# Patient Record
Sex: Male | Born: 1950 | Race: White | Hispanic: No | Marital: Married | Smoking: Former smoker
Health system: Southern US, Community
[De-identification: ages and names within clinical notes are randomized; demographics above are authoritative.]

## PROBLEM LIST (undated history)

## (undated) DIAGNOSIS — E119 Type 2 diabetes mellitus without complications: Secondary | ICD-10-CM

## (undated) HISTORY — DX: Type 2 diabetes mellitus without complications: E11.9

---

## 1999-03-20 ENCOUNTER — Ambulatory Visit (HOSPITAL_BASED_OUTPATIENT_CLINIC_OR_DEPARTMENT_OTHER): Admission: RE | Admit: 1999-03-20 | Discharge: 1999-03-20 | Payer: Self-pay | Admitting: General Surgery

## 2014-03-30 ENCOUNTER — Encounter: Payer: 59 | Attending: Family Medicine

## 2014-03-30 VITALS — Ht 74.0 in | Wt 194.2 lb

## 2014-03-30 DIAGNOSIS — E119 Type 2 diabetes mellitus without complications: Secondary | ICD-10-CM | POA: Insufficient documentation

## 2014-03-30 DIAGNOSIS — Z713 Dietary counseling and surveillance: Secondary | ICD-10-CM | POA: Diagnosis present

## 2014-03-30 NOTE — Progress Notes (Signed)
Patient was seen on 03/30/2014 for the first of a series of three diabetes self-management courses at the [facility].  Patient Education Plan per assessed needs and concerns is to attend four course education program for Diabetes Self Management Education.  Current HbA1c: 6.5%  The following learning objectives were met by the patient during this class:  Describe diabetes  State some common risk factors for diabetes  Defines the role of glucose and insulin  Identifies type of diabetes and pathophysiology  Describe the relationship between diabetes and cardiovascular risk  State the members of the Healthcare Team  States the rationale for glucose monitoring  State when to test glucose  State their individual Target Range  State the importance of logging glucose readings  Describe how to interpret glucose readings  Identifies A1C target  Explain the correlation between A1c and eAG values  State symptoms and treatment of high blood glucose  State symptoms and treatment of low blood glucose  Explain proper technique for glucose testing  Identifies proper sharps disposal  Handouts given during class include:  Living Well with Diabetes book  Carb Counting and Meal Planning book  Meal Plan Card  Carbohydrate guide  Meal planning worksheet  Low Sodium Flavoring Tips  The diabetes portion plate  C1U to eAG Conversion Chart  Diabetes Medications  Diabetes Recommended Care Schedule  Support Group  Diabetes Success Plan  Core Class Satisfaction Survey  Follow-Up Plan:  Attend core 2

## 2014-04-06 ENCOUNTER — Encounter: Payer: 59 | Attending: Family Medicine

## 2014-04-06 DIAGNOSIS — E119 Type 2 diabetes mellitus without complications: Secondary | ICD-10-CM | POA: Diagnosis present

## 2014-04-06 DIAGNOSIS — Z713 Dietary counseling and surveillance: Secondary | ICD-10-CM | POA: Diagnosis present

## 2014-04-07 NOTE — Progress Notes (Signed)

## 2014-04-20 DIAGNOSIS — E119 Type 2 diabetes mellitus without complications: Secondary | ICD-10-CM | POA: Diagnosis not present

## 2014-04-24 NOTE — Progress Notes (Signed)
Patient was seen on 9/17/15for the third of a series of three diabetes self-management courses at the [facility]. The following learning objectives were met by the patient during this class:    State the amount of activity recommended for healthy living   Describe activities suitable for individual needs   Identify ways to regularly incorporate activity into daily life   Identify barriers to activity and ways to over come these barriers  Identify diabetes medications being personally used and their primary action for lowering glucose and possible side effects   Describe role of stress on blood glucose and develop strategies to address psychosocial issues   Identify diabetes complications and ways to prevent them  Explain how to manage diabetes during illness   Evaluate success in meeting personal goal   Establish 2-3 goals that they will plan to diligently work on until they return for the  22-monthfollow-up visit  Goals:   I will count my carb choices at most meals and snacks  I will be active 40 minutes or more 5 times a week  I will test my glucose at least 2 times a day, 7 days a week  Retire from my stressful job  I will look at patterns in my record book at least 4 days a month  To help manage stress I will  Do photography and walk  at least 3 times a week  No potential barriers noted that may exist toward accomplishing goals Support Plan: Family Support, On-Line resources, [facility]  Plan:  Attend Core 4 in 4 months  Bring food record and glucose log to all healthcare visits

## 2014-08-07 ENCOUNTER — Encounter: Payer: 59 | Attending: Family Medicine

## 2014-08-07 DIAGNOSIS — E119 Type 2 diabetes mellitus without complications: Secondary | ICD-10-CM | POA: Diagnosis present

## 2014-08-07 DIAGNOSIS — Z713 Dietary counseling and surveillance: Secondary | ICD-10-CM | POA: Insufficient documentation

## 2014-08-08 NOTE — Progress Notes (Signed)
Appt start time: 1730 end time:  1830.  Patient was seen on 08/07/2013 for a review of the series of three diabetes self-management courses at the [facility]. The following learning objectives were met by the patient during this class:  . Reviewed blood glucose monitoring and interpretation including the recommended target ranges and Hgb A1c.  . Reviewed on carb counting, importance of regularly scheduled meals/snacks, and meal planning.  . Reviewed the effects of physical activity on glucose levels and long-term glucose control.  Recommended goal of 150 minutes of physical activity/week. . Reviewed patient medications and discussed role of medication on blood glucose and possible side effects. . Discussed strategies to manage stress, psychosocial issues, and other obstacles to diabetes management. . Encouraged moderate weight reduction to improve glucose levels.   . Reviewed short-term complications: hyper- and hypo-glycemia.  Discussed causes, symptoms, and treatment options. . Reviewed prevention, detection, and treatment of long-term complications.  Discussed the role of prolonged elevated glucose levels on body systems.  Goals:  Follow Diabetes Meal Plan as instructed  Eat 3 meals and 2 snacks, every 3-5 hrs  Limit carbohydrate intake to 45 grams carbohydrate/meal Limit carbohydrate intake to 15 grams carbohydrate/snack Add lean protein foods to meals/snacks  Monitor glucose levels as instructed by your doctor  Aim for goal of 15-30 mins of physical activity daily as tolerated  Bring food record and glucose log to your next nutrition visit  

## 2014-10-19 ENCOUNTER — Other Ambulatory Visit: Payer: Self-pay | Admitting: Gastroenterology

## 2015-05-15 DIAGNOSIS — Z136 Encounter for screening for cardiovascular disorders: Secondary | ICD-10-CM | POA: Diagnosis not present

## 2015-05-15 DIAGNOSIS — E119 Type 2 diabetes mellitus without complications: Secondary | ICD-10-CM | POA: Diagnosis not present

## 2015-05-15 DIAGNOSIS — Z23 Encounter for immunization: Secondary | ICD-10-CM | POA: Diagnosis not present

## 2015-05-15 DIAGNOSIS — Z125 Encounter for screening for malignant neoplasm of prostate: Secondary | ICD-10-CM | POA: Diagnosis not present

## 2015-05-15 DIAGNOSIS — Z0000 Encounter for general adult medical examination without abnormal findings: Secondary | ICD-10-CM | POA: Diagnosis not present

## 2017-08-12 ENCOUNTER — Ambulatory Visit: Payer: Medicare Other | Admitting: Podiatry

## 2017-08-12 DIAGNOSIS — Z79899 Other long term (current) drug therapy: Secondary | ICD-10-CM | POA: Diagnosis not present

## 2017-08-12 DIAGNOSIS — B351 Tinea unguium: Secondary | ICD-10-CM | POA: Diagnosis not present

## 2017-08-12 MED ORDER — TERBINAFINE HCL 250 MG PO TABS: 250.0000 mg | ORAL_TABLET | Freq: Every day | ORAL | 0 refills | Status: DC

## 2017-08-12 NOTE — Progress Notes (Signed)
   Subjective:    Patient ID: Zachary Trujillo, male    DOB: 05/14/1951, 67 y.o.   MRN: [id number]  HPI    Review of Systems  All other systems reviewed and are negative.      Objective:   Physical Exam        Assessment & Plan:

## 2017-08-16 NOTE — Progress Notes (Signed)
   Subjective: 67 year old male with past medical history of diabetes mellitus presenting today as a new patient with a complaint of discoloration and thickening of the right great toe that began several months ago.  He is concerned for fungal nails and is afraid he is going to lose the nail.  He has not done anything for treatment.  There are no modifying factors noted.  Patient is here for further evaluation and treatment.   Past Medical History:  Diagnosis Date  . Diabetes mellitus without complication     Objective: Physical Exam General: The patient is alert and oriented x3 in no acute distress.  Dermatology: Hyperkeratotic, discolored, thickened, onychodystrophy of right great toenail.  Skin is warm, dry and supple bilateral lower extremities. Negative for open lesions or macerations. Pruritus to the right foot with hyperkeratosis.  Vascular: Palpable pedal pulses bilaterally. No edema or erythema noted. Capillary refill within normal limits.  Neurological: Epicritic and protective threshold grossly intact bilaterally.   Musculoskeletal Exam: Range of motion within normal limits to all pedal and ankle joints bilateral. Muscle strength 5/5 in all groups bilateral.   Assessment: #1 onychodystrophy right great toenail #2 possible onychomycosis #3 hyperkeratotic right great toenail #4 Tinea pedis right foot  Plan of Care:  #1  Patient was evaluated. #2  Orders for liver function tests were placed today.  #3  Prescription for Lamisil 250 mg #90 provided to patient. #4  We will contact patient if LFTs are abnormal. #5  Return to clinic as needed.   Edrick Kins, DPM [facility]  Dr. Edrick Kins, [address]                                        [place], [place] [address]                Office [phone]  Fax [phone]

## 2017-08-18 LAB — HEPATIC FUNCTION PANEL
AG Ratio: 1.8 (calc) (ref 1.0–2.5)
ALKALINE PHOSPHATASE (APISO): 56 U/L (ref 40–115)
ALT: 17 U/L (ref 9–46)
AST: 14 U/L (ref 10–35)
Albumin: 4.4 g/dL (ref 3.6–5.1)
BILIRUBIN DIRECT: 0.1 mg/dL (ref 0.0–0.2)
BILIRUBIN INDIRECT: 0.3 mg/dL (ref 0.2–1.2)
Globulin: 2.5 g/dL (calc) (ref 1.9–3.7)
TOTAL PROTEIN: 6.9 g/dL (ref 6.1–8.1)
Total Bilirubin: 0.4 mg/dL (ref 0.2–1.2)

## 2017-08-19 NOTE — Progress Notes (Signed)
Please contact patient and let him know his liver results are normal. Continue Lamisil. Thanks, Dr. Amalia Hailey

## 2017-08-21 ENCOUNTER — Telehealth: Payer: Self-pay | Admitting: *Deleted

## 2017-08-21 NOTE — Telephone Encounter (Signed)
-----   Message from Zachary Trujillo, DPM sent at 08/19/2017 10:23 AM EST ----- Please contact patient and let him know his liver results are normal. Continue Lamisil. Thanks, Dr. Amalia Hailey

## 2017-08-21 NOTE — Telephone Encounter (Signed)
Unable to leave a message home phone is not in service. I informed pt of Dr. Amalia Hailey review of results and orders.

## 2017-11-13 ENCOUNTER — Telehealth: Payer: Self-pay | Admitting: *Deleted

## 2017-11-13 NOTE — Telephone Encounter (Signed)
Refill request terbinafine. Dr. Amalia Hailey states pt has received #90 therapeutic dosing. Return fax denying.

## 2017-11-18 ENCOUNTER — Telehealth: Payer: Self-pay | Admitting: Podiatry

## 2017-11-18 NOTE — Telephone Encounter (Signed)
Patient has ran out of medication and wanted to know if he can get it refilled again. If you can call him back. The number is [phone]

## 2017-11-18 NOTE — Telephone Encounter (Signed)
I've been trying to get a hold of someone to renew a prescription for me. Someone tried to [place] back [place]. If you would give me a call back, I will try to answer it outside. That number is [phone]. Thanks. Bye.

## 2017-11-18 NOTE — Telephone Encounter (Signed)
I spoke with pt and he states he received my message and thanked me for the call.

## 2017-11-18 NOTE — Telephone Encounter (Signed)
Left message informing pt he had received the therapeutic dosing of #90 and that it would take 6-9 months for healthy out grow to appear, and to call with any concerns and if in 6-9 months he had not seen improvement and Dr. Amalia Hailey would like to see him again.

## 2017-11-18 NOTE — Telephone Encounter (Signed)
Home phone is no longer inservice.

## 2017-12-04 ENCOUNTER — Telehealth: Payer: Self-pay | Admitting: *Deleted

## 2017-12-04 NOTE — Telephone Encounter (Signed)
Refill request for #90 Terbinafine. Return fax denying with statement pt has been contacted.

## 2018-02-01 ENCOUNTER — Ambulatory Visit
Admission: RE | Admit: 2018-02-01 | Discharge: 2018-02-01 | Disposition: A | Discharge status: Home / Self Care | Payer: Medicare Other | Source: Ambulatory Visit | Attending: Family Medicine | Admitting: Family Medicine

## 2018-02-01 ENCOUNTER — Other Ambulatory Visit: Payer: Self-pay | Admitting: Family Medicine

## 2018-02-01 DIAGNOSIS — R05 Cough: Secondary | ICD-10-CM

## 2018-02-01 DIAGNOSIS — R059 Cough, unspecified: Secondary | ICD-10-CM

## 2018-04-28 ENCOUNTER — Other Ambulatory Visit (HOSPITAL_COMMUNITY): Payer: Self-pay | Admitting: Family Medicine

## 2018-04-28 DIAGNOSIS — R011 Cardiac murmur, unspecified: Secondary | ICD-10-CM

## 2018-05-10 ENCOUNTER — Ambulatory Visit (HOSPITAL_COMMUNITY)
Admission: RE | Admit: 2018-05-10 | Discharge: 2018-05-10 | Disposition: A | Discharge status: Home / Self Care | Payer: Medicare Other | Source: Ambulatory Visit | Attending: Cardiovascular Disease | Admitting: Cardiovascular Disease

## 2018-05-10 DIAGNOSIS — I350 Nonrheumatic aortic (valve) stenosis: Secondary | ICD-10-CM | POA: Insufficient documentation

## 2018-05-10 DIAGNOSIS — R011 Cardiac murmur, unspecified: Secondary | ICD-10-CM | POA: Diagnosis not present

## 2018-06-23 ENCOUNTER — Encounter: Payer: Self-pay | Admitting: Cardiovascular Disease

## 2018-06-23 ENCOUNTER — Ambulatory Visit: Payer: Medicare Other | Admitting: Cardiovascular Disease

## 2018-06-23 VITALS — BP 132/70 | HR 71 | Ht 74.0 in | Wt 201.8 lb

## 2018-06-23 DIAGNOSIS — E785 Hyperlipidemia, unspecified: Secondary | ICD-10-CM | POA: Insufficient documentation

## 2018-06-23 DIAGNOSIS — E7800 Pure hypercholesterolemia, unspecified: Secondary | ICD-10-CM

## 2018-06-23 DIAGNOSIS — R0989 Other specified symptoms and signs involving the circulatory and respiratory systems: Secondary | ICD-10-CM

## 2018-06-23 DIAGNOSIS — I350 Nonrheumatic aortic (valve) stenosis: Secondary | ICD-10-CM | POA: Diagnosis not present

## 2018-06-23 NOTE — Progress Notes (Signed)
06/23/2018 Zachary Trujillo   02/16/1951  [id number]  Primary Physician Rankins, Bill Salinas, MD Primary Cardiologist: Lorretta Harp MD Lupe Carney, [place]  HPI:  Zachary Trujillo is a 67 y.o. 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  He was referred by Dr. Drema Dallas for cardiovascular evaluation because of a abnormal 2D echo.  His risk factors include treated hyperlipidemia and family history with a father who had a myocardial infarction.  He is never had a heart attack or stroke.  He denies chest pain or shortness of breath.  He had a murmur for a long time with a recent echo performed 05/10/2018 revealing mild aortic stenosis with a valve area of 1.07 cm and a peak aortic gradient of 36 mmHg.   Current Meds  Medication Sig  . atorvastatin (LIPITOR) 10 MG tablet Take 10 mg by mouth daily.     No Known Allergies  Social History   Socioeconomic History  . Marital status: Married    Spouse name: Not on file  . Number of children: Not on file  . Years of education: Not on file  . Highest education level: Not on file  Occupational History  . Not on file  Social Needs  . Financial resource strain: Not on file  . Food insecurity:    Worry: Not on file    Inability: Not on file  . Transportation needs:    Medical: Not on file    Non-medical: Not on file  Tobacco Use  . Smoking status: Former Research scientist (life sciences)  . Smokeless tobacco: Never Used  Substance and Sexual Activity  . Alcohol use: Not on file  . Drug use: Not on file  . Sexual activity: Not on file  Lifestyle  . Physical activity:    Days per week: Not on file    Minutes per session: Not on file  . Stress: Not on file  Relationships  . Social connections:    Talks on phone: Not on file    Gets together: Not on file    Attends religious service: Not on file    Active member of club or organization: Not on file    Attends meetings of clubs or  organizations: Not on file    Relationship status: Not on file  . Intimate partner violence:    Fear of current or ex partner: Not on file    Emotionally abused: Not on file    Physically abused: Not on file    Forced sexual activity: Not on file  Other Topics Concern  . Not on file  Social History Narrative  . Not on file     Review of Systems: General: negative for chills, fever, night sweats or weight changes.  Cardiovascular: negative for chest pain, dyspnea on exertion, edema, orthopnea, palpitations, paroxysmal nocturnal dyspnea or shortness of breath Dermatological: negative for rash Respiratory: negative for cough or wheezing Urologic: negative for hematuria Abdominal: negative for nausea, vomiting, diarrhea, bright red blood per rectum, melena, or hematemesis Neurologic: negative for visual changes, syncope, or dizziness All other systems reviewed and are otherwise negative except as noted above.    Blood pressure 132/70, pulse 71, height 6\' 2"  (1.88 m), weight 201 lb 12.8 oz (91.5 kg).  General appearance: alert and no distress Neck: no adenopathy, no JVD, supple, symmetrical, trachea midline, thyroid not enlarged, symmetric, no tenderness/mass/nodules and Soft bilateral carotid bruits versus transmitted murmur Lungs: clear to auscultation bilaterally Heart:  Soft outflow tract murmur consistent with aortic stenosis Extremities: extremities normal, atraumatic, no cyanosis or edema Pulses: 2+ and symmetric Skin: Skin color, texture, turgor normal. No rashes or lesions Neurologic: Alert and oriented X 3, normal strength and tone. Normal symmetric reflexes. Normal coordination and gait  EKG sinus rhythm at 71 with septal Q waves.  I personally reviewed this EKG.  ASSESSMENT AND PLAN:   Hyperlipidemia History of hyperlipidemia with lipid profile performed 04/26/2018 revealing total cholesterol of 142, LDL of 82 and HDL of 37.  Mild aortic stenosis Zachary Trujillo is had a long  history of a murmur recent echo performed 05/10/2018 revealing normal LV systolic function with mild aortic stenosis.  His valve area was was 1.087 m with a peak gradient of 36 mmHg.  He is completely asymptomatic.  He has no limitations at this point.  We will we will continue to follow him by 2D echo on annual basis.      Lorretta Harp MD FACP,FACC,FAHA, [facility] 06/23/2018 10:47 AM

## 2018-06-23 NOTE — Assessment & Plan Note (Signed)
History of hyperlipidemia with lipid profile performed 04/26/2018 revealing total cholesterol of 142, LDL of 82 and HDL of 37.

## 2018-06-23 NOTE — Patient Instructions (Signed)
Medication Instructions:  Your physician recommends that you continue on your current medications as directed. Please refer to the Current Medication list given to you today.  If you need a refill on your cardiac medications before your next appointment, please call your pharmacy.   Lab work: none If you have labs (blood work) drawn today and your tests are completely normal, you will receive your results only by: Marland Kitchen MyChart Message (if you have MyChart) OR . A paper copy in the mail If you have any lab test that is abnormal or we need to change your treatment, we will call you to review the results.  Testing/Procedures: Your physician has requested that you have a carotid duplex. This test is an ultrasound of the carotid arteries in your neck. It looks at blood flow through these arteries that supply the brain with blood. Allow one hour for this exam. There are no restrictions or special instructions.  SCHEDULE NOW  Your physician has requested that you have an echocardiogram. Echocardiography is a painless test that uses sound waves to create images of your heart. It provides your doctor with information about the size and shape of your heart and how well your heart's chambers and valves are working. This procedure takes approximately one hour. There are no restrictions for this procedure.  SCHEDULE NOVEMBER 2020  Follow-Up: At [facility], you and your health needs are our priority.  As part of our continuing mission to provide you with exceptional heart care, we have created designated Provider Care Teams.  These Care Teams include your primary Cardiologist (physician) and Advanced Practice Providers (APPs -  Physician Assistants and Nurse Practitioners) who all work together to provide you with the care you need, when you need it. You will need a follow up appointment in 12 months.  Please call our office 2 months in advance to schedule this appointment.  You may see DR. BERRY or one of  the following Advanced Practice Providers on your designated Care Team:   Kerin Ransom, PA-C [place], [place] . Sande Rives, PA-C  Any Other Special Instructions Will Be Listed Below (If Applicable).

## 2018-06-23 NOTE — Assessment & Plan Note (Signed)
Zachary Trujillo is had a long history of a murmur recent echo performed 05/10/2018 revealing normal LV systolic function with mild aortic stenosis.  His valve area was was 1.087 m with a peak gradient of 36 mmHg.  He is completely asymptomatic.  He has no limitations at this point.  We will we will continue to follow him by 2D echo on annual basis.

## 2018-07-06 ENCOUNTER — Ambulatory Visit (HOSPITAL_COMMUNITY)
Admission: RE | Admit: 2018-07-06 | Discharge: 2018-07-06 | Disposition: A | Discharge status: Home / Self Care | Payer: Medicare Other | Source: Ambulatory Visit | Attending: Cardiovascular Disease | Admitting: Cardiovascular Disease

## 2018-07-06 DIAGNOSIS — I350 Nonrheumatic aortic (valve) stenosis: Secondary | ICD-10-CM | POA: Diagnosis not present

## 2018-07-06 DIAGNOSIS — E7800 Pure hypercholesterolemia, unspecified: Secondary | ICD-10-CM | POA: Diagnosis not present

## 2018-07-06 DIAGNOSIS — R0989 Other specified symptoms and signs involving the circulatory and respiratory systems: Secondary | ICD-10-CM | POA: Insufficient documentation

## 2019-06-20 ENCOUNTER — Encounter (HOSPITAL_COMMUNITY): Payer: Self-pay | Admitting: Cardiovascular Disease

## 2019-06-20 ENCOUNTER — Other Ambulatory Visit (HOSPITAL_COMMUNITY): Payer: Medicare Other

## 2019-06-20 ENCOUNTER — Encounter (HOSPITAL_COMMUNITY): Payer: Self-pay

## 2019-07-05 ENCOUNTER — Other Ambulatory Visit: Payer: Self-pay

## 2019-07-05 ENCOUNTER — Ambulatory Visit (HOSPITAL_COMMUNITY): Payer: Medicare Other | Attending: Cardiovascular Disease

## 2019-07-05 DIAGNOSIS — I350 Nonrheumatic aortic (valve) stenosis: Secondary | ICD-10-CM | POA: Insufficient documentation

## 2019-07-05 DIAGNOSIS — R0989 Other specified symptoms and signs involving the circulatory and respiratory systems: Secondary | ICD-10-CM | POA: Insufficient documentation

## 2019-07-05 DIAGNOSIS — I351 Nonrheumatic aortic (valve) insufficiency: Secondary | ICD-10-CM

## 2019-07-05 DIAGNOSIS — E7800 Pure hypercholesterolemia, unspecified: Secondary | ICD-10-CM | POA: Diagnosis not present

## 2019-07-05 DIAGNOSIS — I519 Heart disease, unspecified: Secondary | ICD-10-CM

## 2019-07-13 ENCOUNTER — Ambulatory Visit: Payer: Medicare Other | Admitting: Cardiovascular Disease

## 2019-07-13 ENCOUNTER — Other Ambulatory Visit: Payer: Self-pay

## 2019-07-13 ENCOUNTER — Encounter: Payer: Self-pay | Admitting: Cardiovascular Disease

## 2019-07-13 VITALS — BP 134/68 | HR 68 | Temp 97.2°F | Ht 74.0 in | Wt 200.0 lb

## 2019-07-13 DIAGNOSIS — E782 Mixed hyperlipidemia: Secondary | ICD-10-CM

## 2019-07-13 DIAGNOSIS — I350 Nonrheumatic aortic (valve) stenosis: Secondary | ICD-10-CM | POA: Diagnosis not present

## 2019-07-13 NOTE — Assessment & Plan Note (Signed)
History of hyperlipidemia on low-dose atorvastatin with lipid profile performed 11/16/2018 revealing total cholesterol of 142, LDL of 89 HDL 41

## 2019-07-13 NOTE — Progress Notes (Signed)
07/13/2019 [name]   June 29, 1951  [id number]  Primary Physician Rankins, Bill Salinas, MD Primary Cardiologist: Lorretta Harp MD Lupe Carney, [place]  HPI:  Zachary Trujillo is a 68 y.o.  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  He was referred by Dr. Drema Dallas for cardiovascular evaluation because of a abnormal 2D echo.  I last saw him in the office 06/23/2018. His risk factors include treated hyperlipidemia and family history with a father who had a myocardial infarction.  He is never had a heart attack or stroke.  He denies chest pain or shortness of breath.  He had a murmur for a long time with a recent echo performed 05/10/2018 revealing mild aortic stenosis with a valve area of 1.07 cm and a peak aortic gradient of 36 mmHg.  Since I saw him a year ago he is done well.  He is remained stable.  He denies chest pain or shortness of breath.  Recent 2D echo performed 07/05/2019 showed no change in the severity of his aortic stenosis with a peak gradient of 47 mmHg and a valve area 1.1 cm.  Current Meds  Medication Sig  . ALPRAZolam (XANAX) 0.25 MG tablet Take 0.25 mg by mouth as needed.  Marland Kitchen atorvastatin (LIPITOR) 10 MG tablet Take 10 mg by mouth daily.     No Known Allergies  Social History   Socioeconomic History  . Marital status: Married    Spouse name: Not on file  . Number of children: Not on file  . Years of education: Not on file  . Highest education level: Not on file  Occupational History  . Not on file  Social Needs  . Financial resource strain: Not on file  . Food insecurity    Worry: Not on file    Inability: Not on file  . Transportation needs    Medical: Not on file    Non-medical: Not on file  Tobacco Use  . Smoking status: Former Research scientist (life sciences)  . Smokeless tobacco: Never Used  Substance and Sexual Activity  . Alcohol use: Not on file  . Drug use: Not on file  . Sexual activity: Not on  file  Lifestyle  . Physical activity    Days per week: Not on file    Minutes per session: Not on file  . Stress: Not on file  Relationships  . Social Herbalist on phone: Not on file    Gets together: Not on file    Attends religious service: Not on file    Active member of club or organization: Not on file    Attends meetings of clubs or organizations: Not on file    Relationship status: Not on file  . Intimate partner violence    Fear of current or ex partner: Not on file    Emotionally abused: Not on file    Physically abused: Not on file    Forced sexual activity: Not on file  Other Topics Concern  . Not on file  Social History Narrative  . Not on file     Review of Systems: General: negative for chills, fever, night sweats or weight changes.  Cardiovascular: negative for chest pain, dyspnea on exertion, edema, orthopnea, palpitations, paroxysmal nocturnal dyspnea or shortness of breath Dermatological: negative for rash Respiratory: negative for cough or wheezing Urologic: negative for hematuria Abdominal: negative for nausea, vomiting, diarrhea, bright red blood per rectum, melena, or  hematemesis Neurologic: negative for visual changes, syncope, or dizziness All other systems reviewed and are otherwise negative except as noted above.    Blood pressure 134/68, pulse 68, temperature (!) 97.2 F (36.2 C), height 6\' 2"  (1.88 m), weight 200 lb (90.7 kg).  General appearance: alert and no distress Neck: no adenopathy, no JVD, supple, symmetrical, trachea midline, thyroid not enlarged, symmetric, no tenderness/mass/nodules and Soft bruits versus bilateral transmitted murmur Lungs: clear to auscultation bilaterally Heart: Soft outflow tract murmur consistent with aortic stenosis Extremities: extremities normal, atraumatic, no cyanosis or edema Pulses: 2+ and symmetric Skin: Skin color, texture, turgor normal. No rashes or lesions Neurologic: Alert and oriented X  3, normal strength and tone. Normal symmetric reflexes. Normal coordination and gait  EKG sinus rhythm at 68 with RSR prime in lead V1 consistent with an RV conduction delay.  I personally reviewed this EKG.  ASSESSMENT AND PLAN:   Hyperlipidemia History of hyperlipidemia on low-dose atorvastatin with lipid profile performed 11/16/2018 revealing total cholesterol of 142, LDL of 89 HDL 41  Mild aortic stenosis History of mild to moderate aortic stenosis by 2D echo without symptoms.  His last echo performed 07/05/2019 revealed an aortic valve area of 1.1 cm with a peak gradient of 48 mmHg.  We will continue to follow this on annual basis.      Lorretta Harp MD FACP,FACC,FAHA, [facility] 07/13/2019 3:02 PM

## 2019-07-13 NOTE — Patient Instructions (Signed)
Medication Instructions:  Your physician recommends that you continue on your current medications as directed. Please refer to the Current Medication list given to you today.  If you need a refill on your cardiac medications before your next appointment, please call your pharmacy.   Lab work: NONE  Testing/Procedures: Your physician has requested that you have an echocardiogram in 1 year. Echocardiography is a painless test that uses sound waves to create images of your heart. It provides your doctor with information about the size and shape of your heart and how well your heart's chambers and valves are working. This procedure takes approximately one hour. There are no restrictions for this procedure. [address]   Follow-Up: At [facility], you and your health needs are our priority.  As part of our continuing mission to provide you with exceptional heart care, we have created designated Provider Care Teams.  These Care Teams include your primary Cardiologist (physician) and Advanced Practice Providers (APPs -  Physician Assistants and Nurse Practitioners) who all work together to provide you with the care you need, when you need it. You may see Dr Gwenlyn Found or one of the following Advanced Practice Providers on your designated Care Team:    Kerin Ransom, PA-C  [place], [place]  Coletta Memos, [place] Your physician wants you to follow-up in: 1 year

## 2019-07-13 NOTE — Assessment & Plan Note (Signed)
History of mild to moderate aortic stenosis by 2D echo without symptoms.  His last echo performed 07/05/2019 revealed an aortic valve area of 1.1 cm with a peak gradient of 48 mmHg.  We will continue to follow this on annual basis.

## 2019-08-14 ENCOUNTER — Other Ambulatory Visit: Payer: Self-pay

## 2019-08-14 ENCOUNTER — Emergency Department (HOSPITAL_COMMUNITY): Payer: Medicare Other

## 2019-08-14 ENCOUNTER — Emergency Department (HOSPITAL_COMMUNITY)
Admission: EM | Admit: 2019-08-14 | Discharge: 2019-08-15 | Disposition: A | Discharge status: Home / Self Care | Payer: Medicare Other | Attending: Emergency Medicine | Admitting: Emergency Medicine

## 2019-08-14 DIAGNOSIS — R531 Weakness: Secondary | ICD-10-CM | POA: Insufficient documentation

## 2019-08-14 DIAGNOSIS — R4182 Altered mental status, unspecified: Secondary | ICD-10-CM | POA: Insufficient documentation

## 2019-08-14 DIAGNOSIS — E785 Hyperlipidemia, unspecified: Secondary | ICD-10-CM | POA: Diagnosis not present

## 2019-08-14 DIAGNOSIS — Z79899 Other long term (current) drug therapy: Secondary | ICD-10-CM | POA: Insufficient documentation

## 2019-08-14 DIAGNOSIS — R270 Ataxia, unspecified: Secondary | ICD-10-CM | POA: Diagnosis not present

## 2019-08-14 DIAGNOSIS — D352 Benign neoplasm of pituitary gland: Secondary | ICD-10-CM | POA: Insufficient documentation

## 2019-08-14 DIAGNOSIS — E119 Type 2 diabetes mellitus without complications: Secondary | ICD-10-CM | POA: Diagnosis not present

## 2019-08-14 DIAGNOSIS — R000 Tachycardia, unspecified: Secondary | ICD-10-CM | POA: Insufficient documentation

## 2019-08-14 DIAGNOSIS — Z20822 Contact with and (suspected) exposure to covid-19: Secondary | ICD-10-CM | POA: Insufficient documentation

## 2019-08-14 DIAGNOSIS — R4789 Other speech disturbances: Secondary | ICD-10-CM | POA: Insufficient documentation

## 2019-08-14 DIAGNOSIS — Z87891 Personal history of nicotine dependence: Secondary | ICD-10-CM | POA: Insufficient documentation

## 2019-08-14 DIAGNOSIS — R269 Unspecified abnormalities of gait and mobility: Secondary | ICD-10-CM

## 2019-08-14 DIAGNOSIS — R930 Abnormal findings on diagnostic imaging of skull and head, not elsewhere classified: Secondary | ICD-10-CM | POA: Diagnosis not present

## 2019-08-14 LAB — DIFFERENTIAL
Abs Immature Granulocytes: 0.06 10*3/uL (ref 0.00–0.07)
Basophils Absolute: 0.1 10*3/uL (ref 0.0–0.1)
Basophils Relative: 1 %
Eosinophils Absolute: 0.1 10*3/uL (ref 0.0–0.5)
Eosinophils Relative: 1 %
Immature Granulocytes: 1 %
Lymphocytes Relative: 35 %
Lymphs Abs: 2.7 10*3/uL (ref 0.7–4.0)
Monocytes Absolute: 0.7 10*3/uL (ref 0.1–1.0)
Monocytes Relative: 9 %
Neutro Abs: 4.2 10*3/uL (ref 1.7–7.7)
Neutrophils Relative %: 53 %

## 2019-08-14 LAB — COMPREHENSIVE METABOLIC PANEL
ALT: 23 U/L (ref 0–44)
AST: 16 U/L (ref 15–41)
Albumin: 4.1 g/dL (ref 3.5–5.0)
Alkaline Phosphatase: 52 U/L (ref 38–126)
Anion gap: 7 (ref 5–15)
BUN: 15 mg/dL (ref 8–23)
CO2: 29 mmol/L (ref 22–32)
Calcium: 9.1 mg/dL (ref 8.9–10.3)
Chloride: 103 mmol/L (ref 98–111)
Creatinine, Ser: 0.95 mg/dL (ref 0.61–1.24)
GFR calc Af Amer: >60 mL/min (ref >60)
GFR calc non Af Amer: >60 mL/min (ref >60)
Glucose, Bld: 209 mg/dL — ABNORMAL HIGH (ref 70–99)
Potassium: 3.7 mmol/L (ref 3.5–5.1)
Sodium: 139 mmol/L (ref 135–145)
Total Bilirubin: 0.6 mg/dL (ref 0.3–1.2)
Total Protein: 6.6 g/dL (ref 6.5–8.1)

## 2019-08-14 LAB — URINALYSIS, ROUTINE W REFLEX MICROSCOPIC
Bilirubin Urine: NEGATIVE
Glucose, UA: 50 mg/dL — AB
Hgb urine dipstick: NEGATIVE
Ketones, ur: NEGATIVE mg/dL
Leukocytes,Ua: NEGATIVE
Nitrite: NEGATIVE
Protein, ur: NEGATIVE mg/dL
Specific Gravity, Urine: 1.015 (ref 1.005–1.030)
pH: 7 (ref 5.0–8.0)

## 2019-08-14 LAB — RAPID URINE DRUG SCREEN, HOSP PERFORMED
Amphetamines: NOT DETECTED
Barbiturates: NOT DETECTED
Benzodiazepines: NOT DETECTED
Cocaine: NOT DETECTED
Opiates: NOT DETECTED
Tetrahydrocannabinol: POSITIVE — AB

## 2019-08-14 LAB — APTT: aPTT: 28 seconds (ref 24–36)

## 2019-08-14 LAB — PROTIME-INR
INR: 0.9 (ref 0.8–1.2)
Prothrombin Time: 12.4 seconds (ref 11.4–15.2)

## 2019-08-14 LAB — CBC
HCT: 46.7 % (ref 39.0–52.0)
Hemoglobin: 15.4 g/dL (ref 13.0–17.0)
MCH: 29.1 pg (ref 26.0–34.0)
MCHC: 33 g/dL (ref 30.0–36.0)
MCV: 88.3 fL (ref 80.0–100.0)
Platelets: 182 10*3/uL (ref 150–400)
RBC: 5.29 MIL/uL (ref 4.22–5.81)
RDW: 12.9 % (ref 11.5–15.5)
WBC: 7.8 10*3/uL (ref 4.0–10.5)
nRBC: 0 % (ref 0.0–0.2)

## 2019-08-14 LAB — ETHANOL: Alcohol, Ethyl (B): <10 mg/dL (ref <10)

## 2019-08-14 LAB — CBG MONITORING, ED: Glucose-Capillary: 208 mg/dL — ABNORMAL HIGH (ref 70–99)

## 2019-08-14 LAB — RESPIRATORY PANEL BY RT PCR (FLU A&B, COVID)
Influenza A by PCR: NEGATIVE
Influenza B by PCR: NEGATIVE
SARS Coronavirus 2 by RT PCR: NEGATIVE

## 2019-08-14 LAB — TROPONIN I (HIGH SENSITIVITY)
Troponin I (High Sensitivity): 6 ng/L (ref <18)
Troponin I (High Sensitivity): 9 ng/L (ref <18)

## 2019-08-14 LAB — TSH: TSH: 0.912 u[IU]/mL (ref 0.350–4.500)

## 2019-08-14 MED ORDER — GADOBUTROL 1 MMOL/ML IV SOLN
9.0000 mL | Freq: Once | INTRAVENOUS | Status: AC | PRN
  Administered 2019-08-14: 9 mL via INTRAVENOUS

## 2019-08-14 NOTE — ED Triage Notes (Addendum)
Pt to ED via POV, per pt wife, about an hour ago pt became confused and had difficulty articulating his words, also reports pt was "dragging his feet" Apparently pt took xanax today . Pt currently A&O x 4, but is slow to respond to questions. Denies pain. C/O mouth feeling dry.

## 2019-08-14 NOTE — Discharge Instructions (Signed)
Call Dr. 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

## 2019-08-14 NOTE — ED Notes (Signed)
Patient transported to MRI 

## 2019-08-14 NOTE — ED Notes (Signed)
Patient transported to CT 

## 2019-08-14 NOTE — ED Provider Notes (Signed)
[facility]ERG[facility]Y DEPARTMENT Provider Note   CSN: [id number] Arrival date & time: 08/14/19  1736     History Chief Complaint  Patient presents with  . Altered Mental Status  . Weakness    Zachary Trujillo is a 69 y.o. male.  Pt presents to the ED today with AMS.  The pt feels like he's walking like a 69 yo man.  The pt said he is weak all over.  Pt denies f/c.  He did take a 0.25 mg ativan.          Past Medical History:  Diagnosis Date  . Diabetes mellitus without complication [facility])     Patient Active Problem List   Diagnosis Date Noted  . Hyperlipidemia 06/23/2018  . Mild aortic stenosis 06/23/2018    No past surgical history on file.     No family history on file.  Social History   Tobacco Use  . Smoking status: Former Research scientist (life sciences)  . Smokeless tobacco: Never Used  Substance Use Topics  . Alcohol use: Not on file  . Drug use: Not on file    Home Medications Prior to Admission medications   Medication Sig Start Date End Date Taking? Authorizing Provider  ALPRAZolam Duanne Moron) 0.25 MG tablet Take 0.25 mg by mouth daily as needed for anxiety (stress).  04/26/18  Yes [provider]  atorvastatin (LIPITOR) 10 MG tablet Take 10 mg by mouth daily. 08/06/17  Yes [provider]  ibuprofen (ADVIL) 200 MG tablet Take 400 mg by mouth every 6 (six) hours as needed for headache (pain).   Yes [provider]  OVER THE COUNTER MEDICATION Apply 1 application topically daily as needed (foot itching). OTC medication for athlete's foot   Yes [provider]    Allergies    Patient has no known allergies.  Review of Systems   Review of Systems  Neurological: Positive for weakness.  All other systems reviewed and are negative.   Physical Exam Updated Vital Signs BP (!) 142/74 (BP Location: Right Arm)   Pulse 95   Temp (!) 97.5 F (36.4 C) (Oral)   Resp 12   Ht 6\' 2"  (1.88 m)   Wt 88.5 kg   SpO2 99%   BMI  25.04 kg/m   Physical Exam Vitals and nursing note reviewed.  Constitutional:      Appearance: Normal appearance.  HENT:     Head: Normocephalic and atraumatic.     Right Ear: External ear normal.     Left Ear: External ear normal.     Nose: Nose normal.     Mouth/Throat:     Mouth: Mucous membranes are dry.  Eyes:     Extraocular Movements: Extraocular movements intact.     Conjunctiva/sclera: Conjunctivae normal.     Pupils: Pupils are equal, round, and reactive to light.  Cardiovascular:     Rate and Rhythm: Regular rhythm. Tachycardia present.     Pulses: Normal pulses.     Heart sounds: Normal heart sounds.  Pulmonary:     Effort: Pulmonary effort is normal.     Breath sounds: Normal breath sounds.  Abdominal:     General: Abdomen is flat. Bowel sounds are normal.     Palpations: Abdomen is soft.  Musculoskeletal:        General: Normal range of motion.     Cervical back: Normal range of motion and neck supple.  Skin:    General: Skin is warm.  Capillary Refill: Capillary refill takes less than 2 seconds.  Neurological:     General: No focal deficit present.     Mental Status: He is alert and oriented to person, place, and time.  Psychiatric:        Mood and Affect: Mood normal.        Speech: Speech is delayed.        Behavior: Behavior normal.        Thought Content: Thought content normal.        Judgment: Judgment normal.     ED Results / Procedures / Treatments   Labs (all labs ordered are listed, but only abnormal results are displayed) Labs Reviewed  COMPREHENSIVE METABOLIC PANEL - Abnormal; Notable for the following components:      Result Value   Glucose, Bld 209 (*)    All other components within normal limits  RAPID URINE DRUG SCREEN, HOSP PERFORMED - Abnormal; Notable for the following components:   Tetrahydrocannabinol POSITIVE (*)    All other components within normal limits  URINALYSIS, ROUTINE W REFLEX MICROSCOPIC - Abnormal; Notable  for the following components:   APPearance CLOUDY (*)    Glucose, UA 50 (*)    All other components within normal limits  CBG MONITORING, ED - Abnormal; Notable for the following components:   Glucose-Capillary 208 (*)    All other components within normal limits  RESPIRATORY PANEL BY RT PCR (FLU A&B, COVID)  ETHANOL  PROTIME-INR  APTT  CBC  DIFFERENTIAL  TSH  TROPONIN I (HIGH SENSITIVITY)  TROPONIN I (HIGH SENSITIVITY)    EKG EKG Interpretation  Date/Time:  Sunday August 14 2019 17:57:22 EST Ventricular Rate:  104 PR Interval:    QRS Duration: 104 QT Interval:  376 QTC Calculation: 495 R Axis:   54 Text Interpretation: Sinus tachycardia Consider right atrial enlargement Consider right ventricular hypertrophy Anteroseptal infarct, old No old tracing to compare Confirmed by Isla Pence [phone]) on 08/14/2019 7:13:32 PM   Radiology CT HEAD WO CONTRAST  Result Date: 08/14/2019 CLINICAL DATA:  69 year old male with acute onset confusion, abnormal speech, ataxia. EXAM: CT HEAD WITHOUT CONTRAST TECHNIQUE: Contiguous axial images were obtained from the base of the skull through the vertex without intravenous contrast. COMPARISON:  None. FINDINGS: Brain: Abnormally increased soft tissue in the cavernous sinus more so on the left (series 3, image 13). Evidence of associated chronic bony remodeling of the central skull base as result. Superimposed generalized arterial ectasia. No suprasellar cistern mass or mass effect. Cerebral volume is within normal limits for age. No midline shift, ventriculomegaly, intracranial hemorrhage or evidence of cortically based acute infarction. Gray-white matter differentiation is within normal limits throughout the brain. No cortical encephalomalacia identified. Vascular: Generalized arterial tortuosity. Dominant appearing right vertebral artery. No suspicious intracranial vascular hyperdensity. Skull: No acute osseous abnormality identified.  Sinuses/Orbits: Visualized paranasal sinuses and mastoids are clear. Other: Visualized orbits and scalp soft tissues are within normal limits. IMPRESSION: 1. Abnormal increased soft tissue in the cavernous sinus especially on the left. Top differential considerations include cavernous Meningioma and ICA Aneurysm. MRI Head without and with contrast and MRA Head without contrast follow-up may be most valuable in this clinical setting. 2. Otherwise normal for age noncontrast CT appearance of the brain. No acute intracranial hemorrhage or infarct identified. Electronically Signed   By: Genevie Ann M.D.   On: 08/14/2019 19:11   MR ANGIO HEAD WO CONTRAST  Result Date: 08/14/2019 CLINICAL DATA:  Cavernous sinus lesion. Ataxia. EXAM:  MRI HEAD WITHOUT AND WITH CONTRAST MRA HEAD WITHOUT CONTRAST TECHNIQUE: Multiplanar, multiecho pulse sequences of the brain and surrounding structures were obtained without and with intravenous contrast. Angiographic images of the head were obtained using MRA technique without contrast. CONTRAST:  27mL GADAVIST GADOBUTROL 1 MMOL/ML IV SOLN COMPARISON:  Head CT 08/14/2019 FINDINGS: MRI HEAD FINDINGS BRAIN: There is no acute infarct, acute hemorrhage or extra-axial collection. The white matter signal is normal for the patient's age. The cerebral and cerebellar volume are age-appropriate. There is no hydrocephalus. Blood-sensitive sequences show chronic hemosiderin deposition within the left frontal lobe. Pituitary/Sella: There is an intra sellar mass that measures 1.9 cm AP x 2.3 cm TV x 1.6 cm CC series 27, image 8; series 28, image 8. There is hyperintense T1-weighted signal material within the pituitary gland, likely hemorrhage. The infundibulum is midline. The hypothalamus and mamillary bodies are normal. There is no mass effect on the optic chiasm or optic nerves. The infundibular and chiasmatic recesses are clear. Cavernous carotid flow voids are preserved. On the left, there is grade 3  invasion of the cavernous sinus with tumor extending beyond the lateral carotid line. SKULL AND UPPER CERVICAL SPINE: The visualized skull base, calvarium, upper cervical spine and extracranial soft tissues are normal. SINUSES/ORBITS: No fluid levels or advanced mucosal thickening. No mastoid or middle ear effusion. The orbits are normal. MRA HEAD FINDINGS POSTERIOR CIRCULATION: --Basilar artery: Normal. --Posterior cerebral arteries: Normal. Both originate from the basilar artery. --Superior cerebellar arteries: Normal. --Inferior cerebellar arteries: Normal anterior and posterior inferior cerebellar arteries. ANTERIOR CIRCULATION: --Intracranial internal carotid arteries: Normal. --Anterior cerebral arteries: Normal. Both A1 segments are present. Patent anterior communicating artery. --Middle cerebral arteries: Normal. --Posterior communicating arteries: Absent or diminutive bilaterally. IMPRESSION: 1. 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 No mass effect on the optic chiasm or optic nerves. Cavernous carotid flow voids are preserved. 2. Normal intracranial MRA. Electronically Signed   By: Ulyses Jarred M.D.   On: 08/14/2019 21:21   MR Brain W and Wo Contrast  Result Date: 08/14/2019 CLINICAL DATA:  Cavernous sinus lesion. Ataxia. EXAM: MRI HEAD WITHOUT AND WITH CONTRAST MRA HEAD WITHOUT CONTRAST TECHNIQUE: Multiplanar, multiecho pulse sequences of the brain and surrounding structures were obtained without and with intravenous contrast. Angiographic images of the head were obtained using MRA technique without contrast. CONTRAST:  70mL GADAVIST GADOBUTROL 1 MMOL/ML IV SOLN COMPARISON:  Head CT 08/14/2019 FINDINGS: MRI HEAD FINDINGS BRAIN: There is no acute infarct, acute hemorrhage or extra-axial collection. The white matter signal is normal for the patient's age. The cerebral and cerebellar volume are age-appropriate. There is no hydrocephalus.  Blood-sensitive sequences show chronic hemosiderin deposition within the left frontal lobe. Pituitary/Sella: There is an intra sellar mass that measures 1.9 cm AP x 2.3 cm TV x 1.6 cm CC series 27, image 8; series 28, image 8. There is hyperintense T1-weighted signal material within the pituitary gland, likely hemorrhage. The infundibulum is midline. The hypothalamus and mamillary bodies are normal. There is no mass effect on the optic chiasm or optic nerves. The infundibular and chiasmatic recesses are clear. Cavernous carotid flow voids are preserved. On the left, there is grade 3 invasion of the cavernous sinus with tumor extending beyond the lateral carotid line. SKULL AND UPPER CERVICAL SPINE: The visualized skull base, calvarium, upper cervical spine and extracranial soft tissues are normal. SINUSES/ORBITS: No fluid levels or advanced mucosal thickening. No mastoid or  middle ear effusion. The orbits are normal. MRA HEAD FINDINGS POSTERIOR CIRCULATION: --Basilar artery: Normal. --Posterior cerebral arteries: Normal. Both originate from the basilar artery. --Superior cerebellar arteries: Normal. --Inferior cerebellar arteries: Normal anterior and posterior inferior cerebellar arteries. ANTERIOR CIRCULATION: --Intracranial internal carotid arteries: Normal. --Anterior cerebral arteries: Normal. Both A1 segments are present. Patent anterior communicating artery. --Middle cerebral arteries: Normal. --Posterior communicating arteries: Absent or diminutive bilaterally. IMPRESSION: 1. 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 No mass effect on the optic chiasm or optic nerves. Cavernous carotid flow voids are preserved. 2. Normal intracranial MRA. Electronically Signed   By: Ulyses Jarred M.D.   On: 08/14/2019 21:21    Procedures Procedures (including critical care time)  Medications Ordered in ED Medications  gadobutrol (GADAVIST) 1 MMOL/ML  injection 9 mL (9 mLs Intravenous Contrast Given 08/14/19 2038)    ED Course  I have reviewed the triage vital signs and the nursing notes.  Pertinent labs & imaging results that were available during my care of the patient were reviewed by me and considered in my medical decision making (see chart for details).    MDM Rules/Calculators/A&P                      Pt is positive for MJ.  He said he's not been using MJ and has not been around anyone with MJ.  I spoke with the pt's wife and she said pt does not use MJ.    Pt was observed for several hours and feels back to nl.  The pt is able to walk without any problems.  The finding of a pituitary microadenoma was d/w Dr. Ellene Route who will follow up with pt as an outpatient.  Pt stable for d/c.  Return if worse.   Final Clinical Impression(s) / ED Diagnoses Final diagnoses:  Pituitary macroadenoma ([place])  Gait disturbance    Rx / DC Orders ED Discharge Orders    None       Isla Pence, MD 08/15/19 0000

## 2019-08-14 NOTE — ED Notes (Signed)
EDP at bedside  

## 2019-09-04 ENCOUNTER — Ambulatory Visit: Payer: Medicare Other

## 2019-09-09 ENCOUNTER — Ambulatory Visit: Payer: Medicare Other

## 2019-09-10 ENCOUNTER — Ambulatory Visit: Payer: Medicare Other

## 2020-07-10 ENCOUNTER — Other Ambulatory Visit: Payer: Self-pay

## 2020-07-10 ENCOUNTER — Ambulatory Visit (HOSPITAL_COMMUNITY): Payer: Medicare Other | Attending: Internal Medicine

## 2020-07-10 DIAGNOSIS — I350 Nonrheumatic aortic (valve) stenosis: Secondary | ICD-10-CM

## 2020-07-10 LAB — ECHOCARDIOGRAM COMPLETE
AR max vel: 0.94 cm2
AV Area VTI: 1 cm2
AV Area mean vel: 0.98 cm2
AV Mean grad: 29.5 mmHg
AV Peak grad: 52 mmHg
Ao pk vel: 3.61 m/s
Area-P 1/2: 2.59 cm2
P 1/2 time: 336 msec
S' Lateral: 3.1 cm

## 2020-07-20 ENCOUNTER — Ambulatory Visit: Payer: Medicare Other | Admitting: Cardiovascular Disease

## 2020-07-20 ENCOUNTER — Encounter: Payer: Self-pay | Admitting: Cardiovascular Disease

## 2020-07-20 ENCOUNTER — Other Ambulatory Visit: Payer: Self-pay

## 2020-07-20 VITALS — BP 144/70 | HR 63 | Ht 74.0 in | Wt 198.0 lb

## 2020-07-20 DIAGNOSIS — I350 Nonrheumatic aortic (valve) stenosis: Secondary | ICD-10-CM | POA: Diagnosis not present

## 2020-07-20 DIAGNOSIS — E782 Mixed hyperlipidemia: Secondary | ICD-10-CM | POA: Diagnosis not present

## 2020-07-20 NOTE — Progress Notes (Signed)
07/20/2020 [name]   1951/07/20  [id number]  Primary Physician Rankins, Bill Salinas, MD Primary Cardiologist: Lorretta Harp MD Lupe Carney, [place]  HPI:  Zachary Trujillo is a 69 y.o.  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 He was referred by Dr. Drema Dallas for cardiovascular evaluation because of a abnormal 2D echo.  I last saw him in the office 07/13/2019.His risk factors include treated hyperlipidemia and family history with a father who had a myocardial infarction. He is never had a heart attack or stroke. He denies chest pain or shortness of breath. He had a murmur for a long time with a recent echo performed 05/10/2018 revealing mild aortic stenosis with a valve area of 1.07 cm and a peak aortic gradient of 36 mmHg.  Since I saw him a year ago he is done well.    He denies chest pain, shortness of breath or dizziness.  Recent 2D echo performed 08/11/2019 was essentially unchanged with at least moderate aortic stenosis and a valve area of 1 cm with a peak gradient of 52 mmHg.  There was mild to moderate AI as well.   Current Meds  Medication Sig  . ALPRAZolam (XANAX) 0.25 MG tablet Take 0.25 mg by mouth daily as needed for anxiety (stress).   Marland Kitchen atorvastatin (LIPITOR) 10 MG tablet Take 10 mg by mouth daily.  Marland Kitchen ibuprofen (ADVIL) 200 MG tablet Take 400 mg by mouth every 6 (six) hours as needed for headache (pain).     No Known Allergies  Social History   Socioeconomic History  . Marital status: Married    Spouse name: Not on file  . Number of children: Not on file  . Years of education: Not on file  . Highest education level: Not on file  Occupational History  . Not on file  Tobacco Use  . Smoking status: Former Research scientist (life sciences)  . Smokeless tobacco: Never Used  Substance and Sexual Activity  . Alcohol use: Not on file  . Drug use: Not on file  . Sexual activity: Not on file  Other Topics Concern   . Not on file  Social History Narrative  . Not on file   Social Determinants of Health   Financial Resource Strain: Not on file  Food Insecurity: Not on file  Transportation Needs: Not on file  Physical Activity: Not on file  Stress: Not on file  Social Connections: Not on file  Intimate Partner Violence: Not on file     Review of Systems: General: negative for chills, fever, night sweats or weight changes.  Cardiovascular: negative for chest pain, dyspnea on exertion, edema, orthopnea, palpitations, paroxysmal nocturnal dyspnea or shortness of breath Dermatological: negative for rash Respiratory: negative for cough or wheezing Urologic: negative for hematuria Abdominal: negative for nausea, vomiting, diarrhea, bright red blood per rectum, melena, or hematemesis Neurologic: negative for visual changes, syncope, or dizziness All other systems reviewed and are otherwise negative except as noted above.    Blood pressure (!) 144/70, pulse 63, height 6\' 2"  (1.88 m), weight 198 lb (89.8 kg).  General appearance: alert and no distress Neck: no adenopathy, no JVD, supple, symmetrical, trachea midline, thyroid not enlarged, symmetric, no tenderness/mass/nodules and Soft bilateral carotid bruits versus transmitted murmur Lungs: clear to auscultation bilaterally Heart: 2/6 outflow tract murmur consistent with aortic stenosis Extremities: extremities normal, atraumatic, no cyanosis or edema Pulses: 2+ and symmetric Skin: Skin color, texture, turgor normal. No rashes  or lesions Neurologic: Alert and oriented X 3, normal strength and tone. Normal symmetric reflexes. Normal coordination and gait  EKG sinus rhythm at 63 with septal Q waves and nonspecific ST and T wave changes.  I personally reviewed this EKG.  ASSESSMENT AND PLAN:   Hyperlipidemia History of hyperlipidemia on low-dose statin therapy with lipid profile performed 08/18/2019 revealing total cholesterol of 162, LDL of 107 and  HDL of 41.  I am going to get a coronary calcium score to further determine how aggressively to treat his mild hyperlipidemia.  Mild aortic stenosis History of mild to severe aortic stenosis and mild to moderate AI by recent 2D echo performed 07/10/2020.  He had normal LV function.  He really has no clinical signs of aortic stenosis.  His echoes have remained stable over the last year.  I am going to recheck a 2D echo in 12 months.      Lorretta Harp MD FACP,FACC,FAHA, [facility] 07/20/2020 11:49 AM

## 2020-07-20 NOTE — Assessment & Plan Note (Signed)
History of hyperlipidemia on low-dose statin therapy with lipid profile performed 08/18/2019 revealing total cholesterol of 162, LDL of 107 and HDL of 41.  I am going to get a coronary calcium score to further determine how aggressively to treat his mild hyperlipidemia.

## 2020-07-20 NOTE — Assessment & Plan Note (Addendum)
History of mild to severe aortic stenosis and mild to moderate AI by recent 2D echo performed 07/10/2020.  He had normal LV function.  He really has no clinical signs of aortic stenosis.  His echoes have remained stable over the last year.  I am going to recheck a 2D echo in 12 months.

## 2020-07-20 NOTE — Patient Instructions (Signed)
Medication Instructions:  Your physician recommends that you continue on your current medications as directed. Please refer to the Current Medication list given to you today.  *If you need a refill on your cardiac medications before your next appointment, please call your pharmacy*  Testing/Procedures: Dr. Gwenlyn Found has ordered a CT coronary calcium score. This test is done at [address]. This is $99 out of pocket.   Coronary CalciumScan A coronary calcium scan is an imaging test used to look for deposits of calcium and other fatty materials (plaques) in the inner lining of the blood vessels of the heart (coronary arteries). These deposits of calcium and plaques can partly clog and narrow the coronary arteries without producing any symptoms or warning signs. This puts a person at risk for a heart attack. This test can detect these deposits before symptoms develop. Tell a health care provider about:  Any allergies you have.  All medicines you are taking, including vitamins, herbs, eye drops, creams, and over-the-counter medicines.  Any problems you or family members have had with anesthetic medicines.  Any blood disorders you have.  Any surgeries you have had.  Any medical conditions you have.  Whether you are pregnant or may be pregnant. What are the risks? Generally, this is a safe procedure. However, problems may occur, including:  Harm to a pregnant woman and her unborn baby. This test involves the use of radiation. Radiation exposure can be dangerous to a pregnant woman and her unborn baby. If you are pregnant, you generally should not have this procedure done.  Slight increase in the risk of cancer. This is because of the radiation involved in the test. What happens before the procedure? No preparation is needed for this procedure. What happens during the procedure?  You will undress and remove any jewelry around your neck or chest.  You will put on a  hospital gown.  Sticky electrodes will be placed on your chest. The electrodes will be connected to an electrocardiogram (ECG) machine to record a tracing of the electrical activity of your heart.  A CT scanner will take pictures of your heart. During this time, you will be asked to lie still and hold your breath for 2-3 seconds while a picture of your heart is being taken. The procedure may vary among health care providers and hospitals. What happens after the procedure?  You can get dressed.  You can return to your normal activities.  It is up to you to get the results of your test. Ask your health care provider, or the department that is doing the test, when your results will be ready. Summary  A coronary calcium scan is an imaging test used to look for deposits of calcium and other fatty materials (plaques) in the inner lining of the blood vessels of the heart (coronary arteries).  Generally, this is a safe procedure. Tell your health care provider if you are pregnant or may be pregnant.  No preparation is needed for this procedure.  A CT scanner will take pictures of your heart.  You can return to your normal activities after the scan is done. This information is not intended to replace advice given to you by your health care provider. Make sure you discuss any questions you have with your health care provider. Document Released: 01/17/2008 Document Revised: 06/09/2016 Document Reviewed: 06/09/2016 Elsevier Interactive Patient Education  2017 [place] physician has requested that you have an echocardiogram. Echocardiography is a painless  test that uses sound waves to create images of your heart. It provides your doctor with information about the size and shape of your heart and how well your heart's chambers and valves are working. This procedure takes approximately one hour. There are no restrictions for this procedure. [place]. [employer]. 3rd floor. To be done in Dec.  2022      Follow-Up: At [facility], you and your health needs are our priority.  As part of our continuing mission to provide you with exceptional heart care, we have created designated Provider Care Teams.  These Care Teams include your primary Cardiologist (physician) and Advanced Practice Providers (APPs -  Physician Assistants and Nurse Practitioners) who all work together to provide you with the care you need, when you need it.  We recommend signing up for the patient portal called "MyChart".  Sign up information is provided on this After Visit Summary.  MyChart is used to connect with patients for Virtual Visits (Telemedicine).  Patients are able to view lab/test results, encounter notes, upcoming appointments, etc.  Non-urgent messages can be sent to your provider as well.   To learn more about what you can do with MyChart, go to [email or web address].    Your next appointment:   12 month(s)  The format for your next appointment:   In Person  Provider:   Quay Burow, MD

## 2020-07-20 NOTE — Addendum Note (Signed)
Addended by: Beatrix Fetters on: 07/20/2020 12:03 PM   Modules accepted: Orders

## 2020-07-26 ENCOUNTER — Ambulatory Visit (INDEPENDENT_AMBULATORY_CARE_PROVIDER_SITE_OTHER)
Admission: RE | Admit: 2020-07-26 | Discharge: 2020-07-26 | Disposition: A | Discharge status: Home / Self Care | Payer: Self-pay | Source: Ambulatory Visit | Attending: Cardiovascular Disease | Admitting: Cardiovascular Disease

## 2020-07-26 ENCOUNTER — Other Ambulatory Visit: Payer: Self-pay

## 2020-07-26 DIAGNOSIS — E782 Mixed hyperlipidemia: Secondary | ICD-10-CM

## 2020-07-26 DIAGNOSIS — I350 Nonrheumatic aortic (valve) stenosis: Secondary | ICD-10-CM

## 2020-08-02 ENCOUNTER — Telehealth: Payer: Self-pay | Admitting: Cardiovascular Disease

## 2020-08-02 MED ORDER — ATORVASTATIN CALCIUM 40 MG PO TABS: 40.0000 mg | ORAL_TABLET | Freq: Every day | ORAL | 4 refills | Status: DC

## 2020-08-02 NOTE — Telephone Encounter (Signed)
Patient states he is returning a call from [place]. He states he is OK taking the atorvastatin (LIPITOR)  40 mg.

## 2020-08-02 NOTE — Telephone Encounter (Signed)
Prescription for atorvastatin 40mg  sent to pt's pharmacy.

## 2020-08-17 ENCOUNTER — Ambulatory Visit: Payer: Medicare Other | Admitting: Cardiovascular Disease

## 2020-12-10 ENCOUNTER — Telehealth: Payer: Self-pay | Admitting: Cardiovascular Disease

## 2020-12-10 NOTE — Telephone Encounter (Signed)
Have him come back to see me for an OV first

## 2020-12-10 NOTE — Telephone Encounter (Signed)
Patient states he may be having an aortic stenosis replacement due to having had a stroke on 10/05/20. He is requesting a call back from Dr. Kennon Holter nurse to discuss further.

## 2020-12-10 NOTE — Telephone Encounter (Signed)
Spoke with pt on the phone regarding recent strokes. Pt was getting ready to leave on vacation and while at the airport in [place] he started to feel "not right" and was eventually taken to local hospital via ambulance. Pt was found to have a stroke and a TIA. Pt has no deficits. During stay in hospital pt had a TEE done which resulted that there were no thrombus found, however it was noted that his aortic valve stenosis is now moderate to severe. Mean gradient is 37 mmHg. It was suggested to pt that he may need to have an aortic valve replacement.  Pt states that he would like to follow up with his own cardiologist. Pt is currently wearing a monitor for 30 days to note it pt has a-fib.  Will forward message to Dr. Gwenlyn Found to advise.

## 2020-12-11 NOTE — Telephone Encounter (Signed)
Spoke with pt regarding follow up appointment to see Dr. Gwenlyn Found in the office. Pt able to schedule appointment for 12/19/20. Pt verbalizes understanding.

## 2020-12-19 ENCOUNTER — Other Ambulatory Visit: Payer: Self-pay

## 2020-12-19 ENCOUNTER — Encounter: Payer: Self-pay | Admitting: Cardiovascular Disease

## 2020-12-19 ENCOUNTER — Ambulatory Visit: Payer: Medicare Other | Admitting: Cardiovascular Disease

## 2020-12-19 VITALS — BP 122/70 | HR 62 | Ht 73.0 in | Wt 193.8 lb

## 2020-12-19 DIAGNOSIS — E782 Mixed hyperlipidemia: Secondary | ICD-10-CM | POA: Diagnosis not present

## 2020-12-19 DIAGNOSIS — G459 Transient cerebral ischemic attack, unspecified: Secondary | ICD-10-CM | POA: Diagnosis not present

## 2020-12-19 DIAGNOSIS — I350 Nonrheumatic aortic (valve) stenosis: Secondary | ICD-10-CM

## 2020-12-19 NOTE — Progress Notes (Signed)
12/19/2020 [name]   Apr 06, 1951  [id number]  Primary Physician Rankins, Bill Salinas, MD Primary Cardiologist: Lorretta Harp MD Lupe Carney, [place]  HPI:  Lumir Demetriou is a 70 y.o.  married Caucasian male father of 23, grandfather for grandchildren who is retired Pension scheme manager. He was referred by Dr. Drema Dallas for cardiovascular evaluation because of a abnormal 2D echo.I last saw him in the office  07/20/2020.His risk factors include treated hyperlipidemia and family history with a father who had a myocardial infarction. He is never had a heart attack or stroke. He denies chest pain or shortness of breath. He had a murmur for a long time with a recent echo performed 05/10/2018 revealing mild aortic stenosis with a valve area of 1.07 cm and a peak aortic gradient of 36 mmHg.  Since I saw him 6 months ago he did have a TIA while in [place] in the airport heading to [place] earlier this month.  He was hospitalized [facility] for 3 days.  Transesophageal echo showed no left atrial thrombus and his aortic stenosis was moderately severe but unchanged from his previous 2D echo.  He is wearing a 30-day event monitor.  He was also placed on Plavix.  He has an appointment with the stroke neurologist and trial in the next several weeks.  He denies chest pain or shortness of breath.   Current Meds  Medication Sig  . ALPRAZolam (XANAX) 0.25 MG tablet Take 0.25 mg by mouth daily as needed for anxiety (stress).   Marland Kitchen atorvastatin (LIPITOR) 40 MG tablet Take 1 tablet (40 mg total) by mouth daily.  . Blood Glucose Monitoring Suppl (ONE TOUCH ULTRA 2) w/Device KIT 2 (two) times daily.  . clopidogrel (PLAVIX) 75 MG tablet Take by mouth daily.  . [employer] ASPIRIN ADULT LOW DOSE 81 MG chewable tablet Chew 81 mg by mouth daily.  . metFORMIN (GLUCOPHAGE) 500 MG tablet Take by mouth daily.  Glory Rosebush Delica Lancets 18H MISC 2 (two) times daily.     No Known Allergies  Social  History   Socioeconomic History  . Marital status: Married    Spouse name: Not on file  . Number of children: Not on file  . Years of education: Not on file  . Highest education level: Not on file  Occupational History  . Not on file  Tobacco Use  . Smoking status: Former Research scientist (life sciences)  . Smokeless tobacco: Never Used  Substance and Sexual Activity  . Alcohol use: Not on file  . Drug use: Not on file  . Sexual activity: Not on file  Other Topics Concern  . Not on file  Social History Narrative  . Not on file   Social Determinants of Health   Financial Resource Strain: Not on file  Food Insecurity: Not on file  Transportation Needs: Not on file  Physical Activity: Not on file  Stress: Not on file  Social Connections: Not on file  Intimate Partner Violence: Not on file     Review of Systems: General: negative for chills, fever, night sweats or weight changes.  Cardiovascular: negative for chest pain, dyspnea on exertion, edema, orthopnea, palpitations, paroxysmal nocturnal dyspnea or shortness of breath Dermatological: negative for rash Respiratory: negative for cough or wheezing Urologic: negative for hematuria Abdominal: negative for nausea, vomiting, diarrhea, bright red blood per rectum, melena, or hematemesis Neurologic: negative for visual changes, syncope, or dizziness All other systems reviewed and are otherwise negative except as noted above.  Blood pressure 122/70, pulse 62, height '6\' 1"'  (1.854 m), weight 193 lb 12.8 oz (87.9 kg), SpO2 93 %.  General appearance: alert and no distress Neck: no adenopathy, no JVD, supple, symmetrical, trachea midline, thyroid not enlarged, symmetric, no tenderness/mass/nodules and Bilateral carotid bruits versus transmitted murmur Lungs: clear to auscultation bilaterally Heart: 2/6 outflow tract murmur consistent with aortic stenosis. Extremities: extremities normal, atraumatic, no cyanosis or edema Pulses: 2+ and symmetric Skin:  Skin color, texture, turgor normal. No rashes or lesions Neurologic: Alert and oriented X 3, normal strength and tone. Normal symmetric reflexes. Normal coordination and gait  EKG sinus rhythm at 62 with septal Q waves and nonspecific ST and T wave changes.  I personally reviewed this EKG.  ASSESSMENT AND PLAN:   Hyperlipidemia History of hyperlipidemia on statin therapy with lipid profile performed 09/10/2020 revealing a total cholesterol 127, LDL 76 and HDL 39.  Mild aortic stenosis History of aortic stenosis which is now moderately severe.  He did have a transesophageal echo done at [facility] on 12/07/2020 revealing normal LV size and function with an aortic valve area of 1.1 cm and a mean gradient of 37 mmHg unchanged from his prior echo.  Importantly, there is no embolic source and left atrium had no thrombus.  This was performed in the setting of a TIA.  His symptoms completely resolved.  TIA (transient ischemic attack) History of recent TIA while in the airport in [place] on his way to [place].  He had slurred speech and facial drooping.  He was admitted to [facility].  TEE showed no embolic source with stable moderately severe aortic stenosis.  He is wearing a 30-day event monitor to rule out occult A. fib.  He was also put on Plavix.  He has a appointment scheduled for follow-up with the stroke neurologist in [place] in the next several weeks.      Lorretta Harp MD FACP,FACC,FAHA, [facility] 12/19/2020 12:02 PM

## 2020-12-19 NOTE — Assessment & Plan Note (Signed)
History of aortic stenosis which is now moderately severe.  He did have a transesophageal echo done at [facility] on 12/07/2020 revealing normal LV size and function with an aortic valve area of 1.1 cm and a mean gradient of 37 mmHg unchanged from his prior echo.  Importantly, there is no embolic source and left atrium had no thrombus.  This was performed in the setting of a TIA.  His symptoms completely resolved.

## 2020-12-19 NOTE — Patient Instructions (Signed)
Medication Instructions:  Your physician recommends that you continue on your current medications as directed. Please refer to the Current Medication list given to you today.  *If you need a refill on your cardiac medications before your next appointment, please call your pharmacy*   Testing/Procedures: Your physician has requested that you have an echocardiogram. Echocardiography is a painless test that uses sound waves to create images of your heart. It provides your doctor with information about the size and shape of your heart and how well your heart's chambers and valves are working. This procedure takes approximately one hour. There are no restrictions for this procedure. This procedure is done at [address]. To be done in May 2023.    Follow-Up: At [facility], you and your health needs are our priority.  As part of our continuing mission to provide you with exceptional heart care, we have created designated Provider Care Teams.  These Care Teams include your primary Cardiologist (physician) and Advanced Practice Providers (APPs -  Physician Assistants and Nurse Practitioners) who all work together to provide you with the care you need, when you need it.  We recommend signing up for the patient portal called "MyChart".  Sign up information is provided on this After Visit Summary.  MyChart is used to connect with patients for Virtual Visits (Telemedicine).  Patients are able to view lab/test results, encounter notes, upcoming appointments, etc.  Non-urgent messages can be sent to your provider as well.   To learn more about what you can do with MyChart, go to [email or web address].    Your next appointment:   12 month(s)  The format for your next appointment:   In Person  Provider:   Jonathan Berry, MD 

## 2020-12-19 NOTE — Assessment & Plan Note (Signed)
History of hyperlipidemia on statin therapy with lipid profile performed 09/10/2020 revealing a total cholesterol 127, LDL 76 and HDL 39.

## 2020-12-19 NOTE — Assessment & Plan Note (Signed)
History of recent TIA while in the airport in [place] on his way to [place].  He had slurred speech and facial drooping.  He was admitted to [facility].  TEE showed no embolic source with stable moderately severe aortic stenosis.  He is wearing a 30-day event monitor to rule out occult A. fib.  He was also put on Plavix.  He has a appointment scheduled for follow-up with the stroke neurologist in [place] in the next several weeks.

## 2021-02-11 IMAGING — MR MR MRA HEAD W/O CM
16 of 20 series · 34 of 48 positions shown · IV contrast (gadavist)
Comparison: Head CT 08/14/2019

CLINICAL DATA: Cavernous sinus lesion. Ataxia.

EXAM:
MRI HEAD WITHOUT AND WITH CONTRAST
MRA HEAD WITHOUT CONTRAST
TECHNIQUE: Multiplanar, multiecho pulse sequences of the brain and surrounding
structures were obtained without and with intravenous contrast.
Angiographic images of the head were obtained using MRA technique
without contrast.
CONTRAST:  9mL GADAVIST GADOBUTROL 1 MMOL/ML IV SOLN

[Series 9: DWI · axial · 3.0mm · 1.04mm/px · z∈[-94,+70]mm · 7 of 112 slices shown (1 of 4)]
[im 1/112]
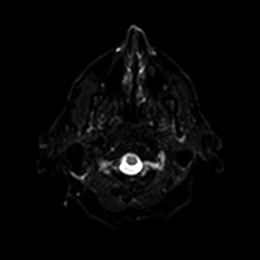
[im 19/112]
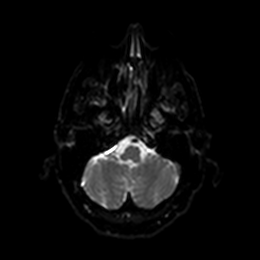
[im 38/112]
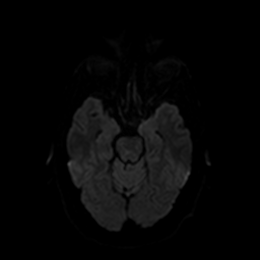
[im 56/112]
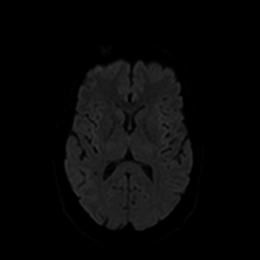
[im 75/112]
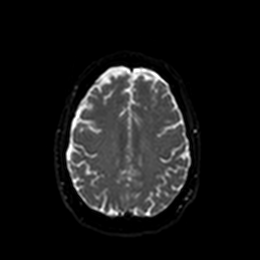
[im 93/112]
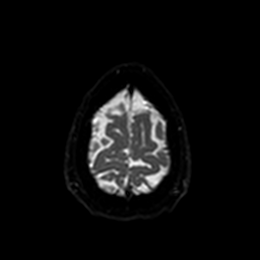
[im 112/112]
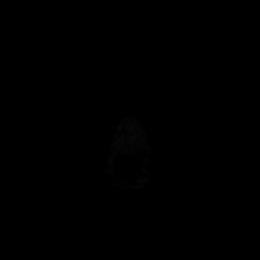

[Series 10: DWI · axial · 3.0mm · 1.04mm/px · z∈[-94,+67]mm · 3 of 55 slices shown (2 of 4)]
[im 1/55]
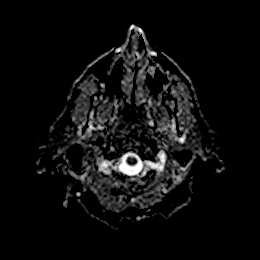
[im 28/55]
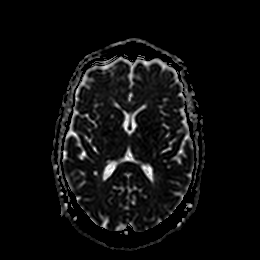
[im 55/55]
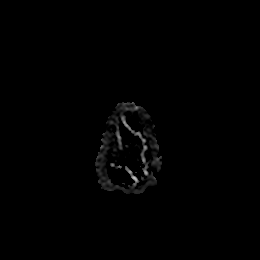

[Series 11: DWI · coronal · 4.0mm · 0.92mm/px · 4 of 78 slices shown (3 of 4)]
[im 1/78]
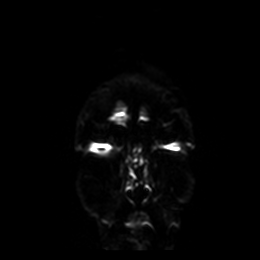
[im 26/78]
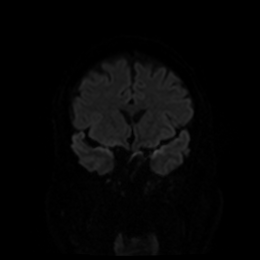
[im 52/78]
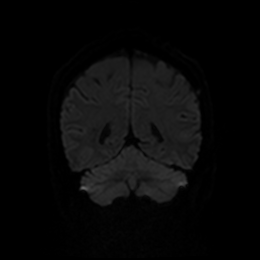
[im 78/78]
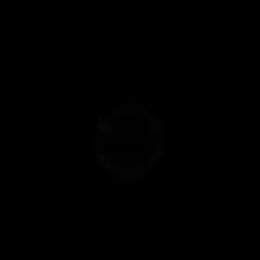

[Series 12: DWI · coronal · 4.0mm · 0.92mm/px · 2 of 39 slices shown (4 of 4)]
[im 1/39]
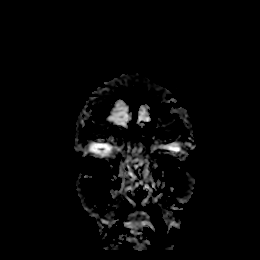
[im 39/39]
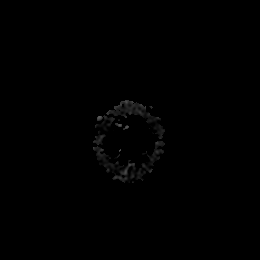

[Series 13: T1 · sagittal · 5.0mm · 0.81mm/px · 1 of 25 slices shown (1 of 3)]
[im 1/25]
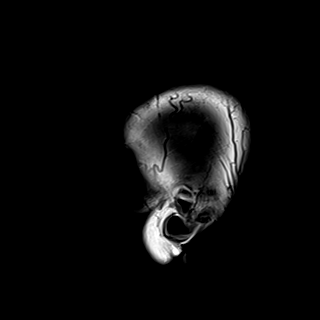

[Series 14: T2 · axial · 5.0mm · 0.75mm/px · z∈[-89,+66]mm · 2 of 27 slices shown]
[im 1/27]
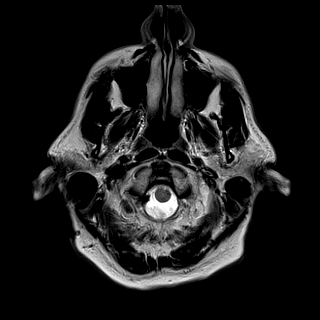
[im 27/27]
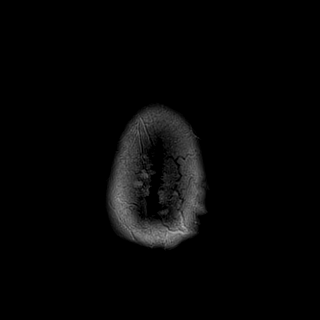

[Series 15: FLAIR · axial · 5.0mm · 0.47mm/px · z∈[-87,+68]mm · 2 of 27 slices shown]
[im 1/27]
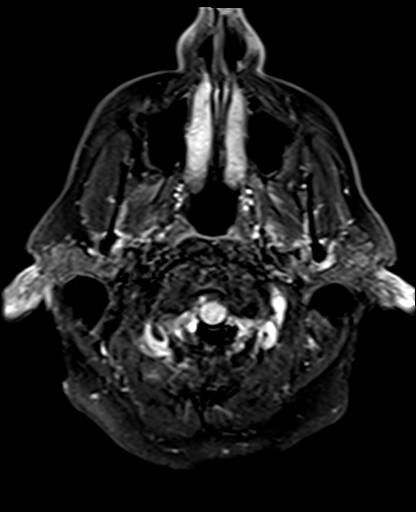
[im 27/27]
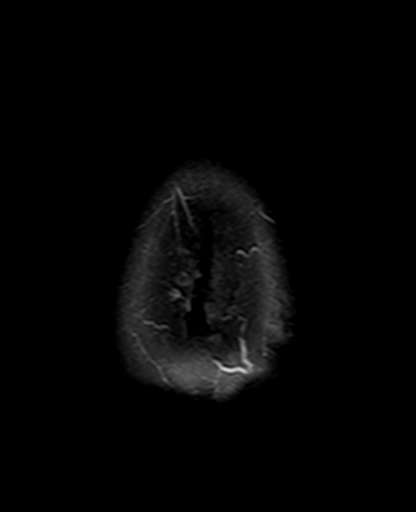

[Series 16: mag_images · axial · 3.0mm · 0.94mm/px · z∈[-96,+80]mm · 3 of 60 slices shown]
[im 1/60]
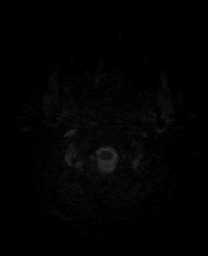
[im 30/60]
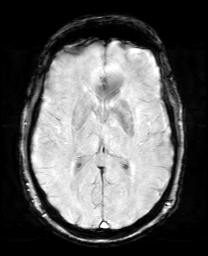
[im 60/60]
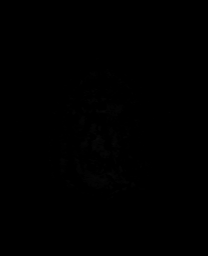

[Series 17: pha_images · axial · 3.0mm · 0.94mm/px · 1 of 60 slices shown]
[im 1/60]
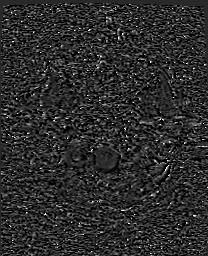

[Series 21: T1 · sagittal · 3.0mm · 0.25mm/px · 1 of 12 slices shown (2 of 3)]
[im 1/12]
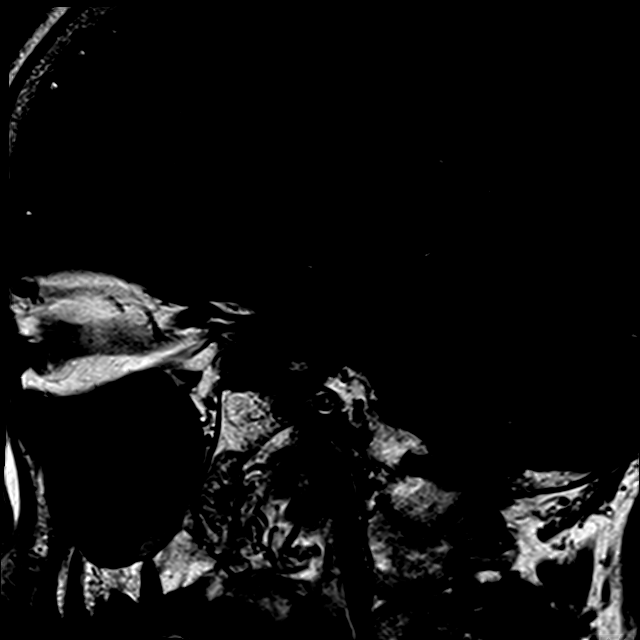

[Series 22: T1 · coronal · 3.0mm · 0.25mm/px · 1 of 13 slices shown (3 of 3)]
[im 1/13]
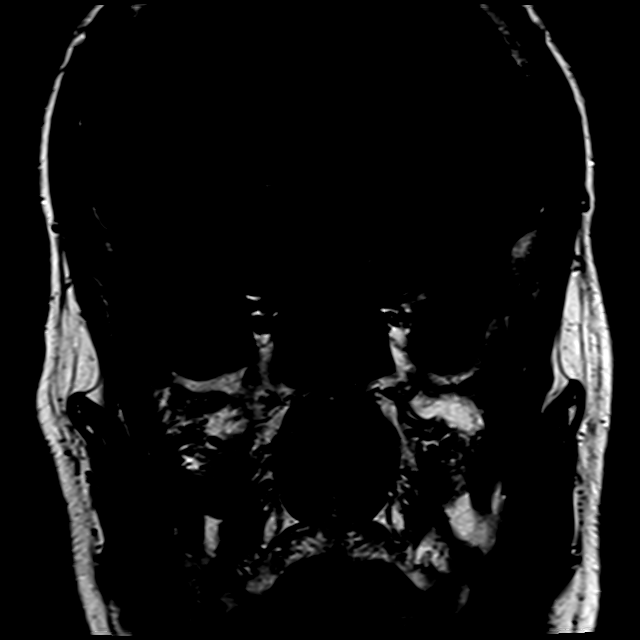

[Series 23: T2 post-contrast · coronal · 5.0mm · 0.75mm/px · 2 of 32 slices shown]
[im 1/32]
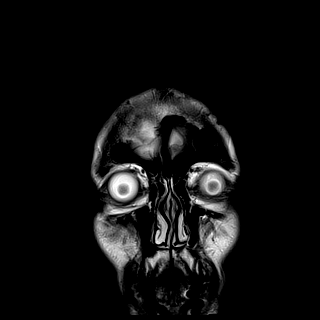
[im 32/32]
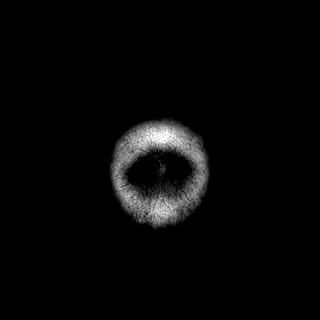

[Series 25: T1 post-contrast · coronal · 5.0mm · 0.38mm/px · 2 of 32 slices shown (1 of 4)]
[im 1/32]
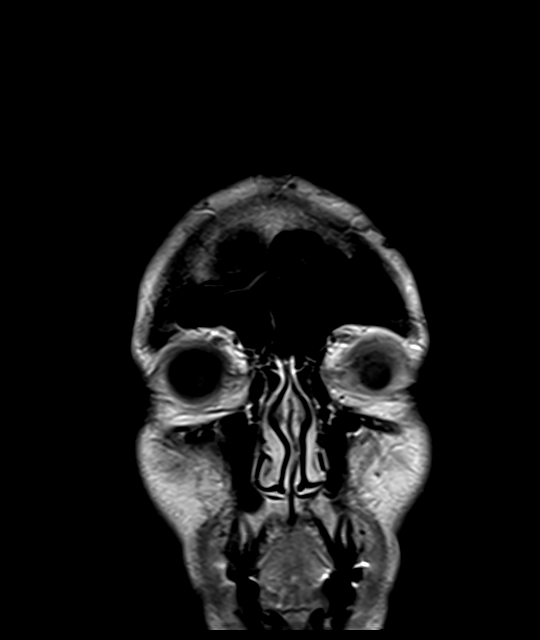
[im 32/32]
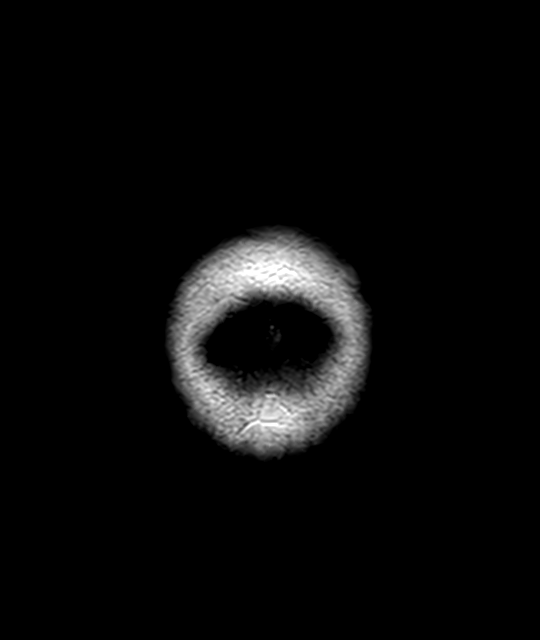

[Series 26: T1 post-contrast · sagittal · 5.0mm · 0.81mm/px · 1 of 25 slices shown (2 of 4)]
[im 1/25]
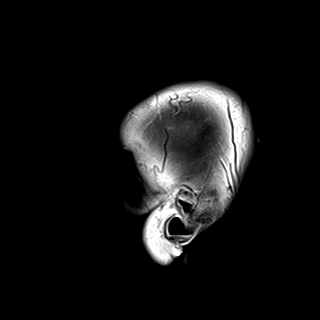

[Series 27: T1 post-contrast · sagittal · 3.0mm · 0.25mm/px · 1 of 12 slices shown (3 of 4)]
[im 1/12]
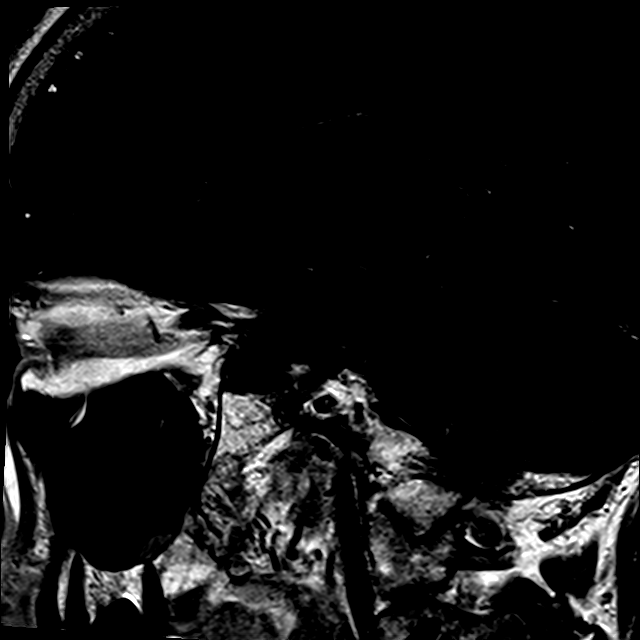

[Series 28: T1 post-contrast · coronal · 3.0mm · 0.25mm/px · 1 of 13 slices shown (4 of 4)]
[im 1/13]
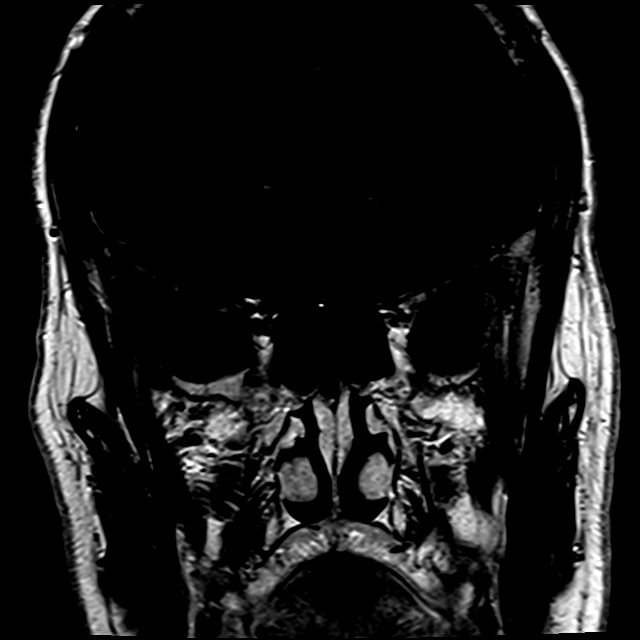

[34 of 48 positions shown; findings below may reference images not displayed]

FINDINGS: MRI HEAD FINDINGS

BRAIN: There is no acute infarct, acute hemorrhage or extra-axial
collection. The white matter signal is normal for the patient's age.
The cerebral and cerebellar volume are age-appropriate. There is no
hydrocephalus. Blood-sensitive sequences show chronic hemosiderin
deposition within the left frontal lobe.

Pituitary/Sella: There is an intra sellar mass that measures 1.9 cm
AP x 2.3 cm TV x 1.6 cm CC series 27, image 8; series 28, image 8.
There is hyperintense T1-weighted signal material within the
pituitary gland, likely hemorrhage. The infundibulum is midline. The
hypothalamus and mamillary bodies are normal. There is no mass
effect on the optic chiasm or optic nerves. The infundibular and
chiasmatic recesses are clear. Cavernous carotid flow voids are
preserved. On the left, there is grade 3 invasion of the cavernous
sinus with tumor extending beyond the lateral carotid line.

SKULL AND UPPER CERVICAL SPINE: The visualized skull base,
calvarium, upper cervical spine and extracranial soft tissues are
normal.

SINUSES/ORBITS: No fluid levels or advanced mucosal thickening. No
mastoid or middle ear effusion. The orbits are normal.

MRA HEAD FINDINGS

POSTERIOR CIRCULATION:

--Basilar artery: Normal.

--Posterior cerebral arteries: Normal. Both originate from the
basilar artery.

--Superior cerebellar arteries: Normal.

--Inferior cerebellar arteries: Normal anterior and posterior
inferior cerebellar arteries.

ANTERIOR CIRCULATION:

--Intracranial internal carotid arteries: Normal.

--Anterior cerebral arteries: Normal. Both A1 segments are present.
Patent anterior communicating artery.

--Middle cerebral arteries: Normal.

--Posterior communicating arteries: Absent or diminutive
bilaterally.
IMPRESSION: 1. Pituitary macroadenoma measuring 1.9 x 2.3 x 1.6 cm with grade 3
invasion of the left cavernous sinus. No mass effect on the optic
chiasm or optic nerves. Cavernous carotid flow voids are preserved.
2. Normal intracranial MRA.

## 2021-07-08 ENCOUNTER — Other Ambulatory Visit: Payer: Self-pay | Admitting: Sports Medicine

## 2021-07-08 ENCOUNTER — Ambulatory Visit
Admission: RE | Admit: 2021-07-08 | Discharge: 2021-07-08 | Disposition: A | Discharge status: Home / Self Care | Payer: Medicare Other | Source: Ambulatory Visit | Attending: Sports Medicine | Admitting: Sports Medicine

## 2021-07-08 DIAGNOSIS — M25551 Pain in right hip: Secondary | ICD-10-CM

## 2021-07-08 DIAGNOSIS — M79651 Pain in right thigh: Secondary | ICD-10-CM

## 2021-07-15 ENCOUNTER — Ambulatory Visit (HOSPITAL_COMMUNITY): Payer: Medicare Other | Attending: Cardiology

## 2021-07-15 ENCOUNTER — Other Ambulatory Visit: Payer: Self-pay

## 2021-07-15 DIAGNOSIS — I350 Nonrheumatic aortic (valve) stenosis: Secondary | ICD-10-CM | POA: Insufficient documentation

## 2021-07-15 DIAGNOSIS — E782 Mixed hyperlipidemia: Secondary | ICD-10-CM

## 2021-07-15 LAB — ECHOCARDIOGRAM COMPLETE
AR max vel: 0.66 cm2
AV Area VTI: 0.65 cm2
AV Area mean vel: 0.66 cm2
AV Mean grad: 40 mmHg
AV Peak grad: 63.4 mmHg
Ao pk vel: 3.98 m/s
Area-P 1/2: 2.48 cm2
P 1/2 time: 331 msec
S' Lateral: 3.6 cm

## 2021-09-06 ENCOUNTER — Encounter: Payer: Self-pay | Admitting: Cardiovascular Disease

## 2021-09-06 ENCOUNTER — Other Ambulatory Visit: Payer: Self-pay

## 2021-09-06 ENCOUNTER — Ambulatory Visit: Payer: Medicare Other | Admitting: Cardiovascular Disease

## 2021-09-06 VITALS — BP 128/72 | HR 74 | Resp 20 | Ht 73.0 in | Wt 200.0 lb

## 2021-09-06 DIAGNOSIS — I350 Nonrheumatic aortic (valve) stenosis: Secondary | ICD-10-CM

## 2021-09-06 DIAGNOSIS — E782 Mixed hyperlipidemia: Secondary | ICD-10-CM

## 2021-09-06 NOTE — Assessment & Plan Note (Signed)
History of hyperlipidemia on Crestor 10 mg a day with lipid profile performed 12/06/2020 revealing a total cholesterol 104, LDL 51 and HDL 38.

## 2021-09-06 NOTE — Progress Notes (Signed)
09/06/2021 [name]   09-26-1950  [id number]  Primary Physician Rankins, Bill Salinas, MD Primary Cardiologist: Lorretta Harp MD Lupe Carney, [place]  HPI:  Zachary Trujillo is a 71 y.o.  married Caucasian male father of 43, grandfather for grandchildren who is retired Pension scheme manager.  He was referred by Dr. Drema Dallas for cardiovascular evaluation because of a abnormal 2D echo.  I last saw him in the office 12/19/2020. His risk factors include treated hyperlipidemia and family history with a father who had a myocardial infarction.  He is never had a heart attack or stroke.  He denies chest pain or shortness of breath.  He had a murmur for a long time with a recent echo performed 05/10/2018 revealing mild aortic stenosis with a valve area of 1.07 cm and a peak aortic gradient of 36 mmHg.   He had a TIA while in [place] in the airport heading to [place] earlier this month.  He was hospitalized [facility] for 3 days.  Transesophageal echo showed no left atrial thrombus and his aortic stenosis was moderately severe but unchanged from his previous 2D echo.  He is wearing a 30-day event monitor.  He was also placed on Plavix.  The event monitor was unrevealing.  Since I saw him 8 months ago he continues to do well.  He said no further episodes.  He is fairly active.  He denies chest pain or shortness of breath or dizziness.  2D echo performed 07/15/2021 revealed progression to severe aortic stenosis with a valve with a valve area of 0.65 cm.  And an increase in his mean gradient from 30 to 40 mmHg.  Current Meds  Medication Sig   ALPRAZolam (XANAX) 0.25 MG tablet Take 0.25 mg by mouth daily as needed for anxiety (stress).    Blood Glucose Monitoring Suppl (ONE TOUCH ULTRA 2) w/Device KIT 2 (two) times daily.   [employer] ASPIRIN ADULT LOW DOSE 81 MG chewable tablet Chew 81 mg by mouth daily.   lisinopril (ZESTRIL) 10 MG tablet Take 1 tablet by mouth daily.   metFORMIN (GLUCOPHAGE)  500 MG tablet Take by mouth daily.   OneTouch Delica Lancets 28M MISC 2 (two) times daily.   rosuvastatin (CRESTOR) 10 MG tablet Take 10 mg by mouth daily.     No Known Allergies  Social History   Socioeconomic History   Marital status: Married    Spouse name: Not on file   Number of children: Not on file   Years of education: Not on file   Highest education level: Not on file  Occupational History   Not on file  Tobacco Use   Smoking status: Former   Smokeless tobacco: Never  Substance and Sexual Activity   Alcohol use: Not on file   Drug use: Not on file   Sexual activity: Not on file  Other Topics Concern   Not on file  Social History Narrative   Not on file   Social Determinants of Health   Financial Resource Strain: Not on file  Food Insecurity: Not on file  Transportation Needs: Not on file  Physical Activity: Not on file  Stress: Not on file  Social Connections: Not on file  Intimate Partner Violence: Not on file     Review of Systems: General: negative for chills, fever, night sweats or weight changes.  Cardiovascular: negative for chest pain, dyspnea on exertion, edema, orthopnea, palpitations, paroxysmal nocturnal dyspnea or shortness of breath Dermatological: negative for rash Respiratory:  negative for cough or wheezing Urologic: negative for hematuria Abdominal: negative for nausea, vomiting, diarrhea, bright red blood per rectum, melena, or hematemesis Neurologic: negative for visual changes, syncope, or dizziness All other systems reviewed and are otherwise negative except as noted above.    Blood pressure 128/72, pulse 74, resp. rate 20, height '6\' 1"'  (1.854 m), weight 200 lb (90.7 kg), SpO2 97 %.  General appearance: alert and no distress Neck: no adenopathy, no JVD, supple, symmetrical, trachea midline, thyroid not enlarged, symmetric, no tenderness/mass/nodules, and bilateral carotid bruits versus transmitted murmur Lungs: clear to auscultation  bilaterally Heart: 2/6 outflow tract murmur consistent with aortic stenosis. Extremities: extremities normal, atraumatic, no cyanosis or edema Pulses: 2+ and symmetric Skin: Skin color, texture, turgor normal. No rashes or lesions Neurologic: Grossly normal  EKG sinus rhythm at 74 with septal Q waves.  I personally reviewed this EKG.  ASSESSMENT AND PLAN:   Hyperlipidemia History of hyperlipidemia on Crestor 10 mg a day with lipid profile performed 12/06/2020 revealing a total cholesterol 104, LDL 51 and HDL 38.  Severe aortic stenosis Aortic stenosis has progressed by 2D echo in the out in the severe range this was performed 07/15/2021.  His mean gradient has increased from 30 to 40 mmHg and his valve area of is 0.65 cm with normal LV systolic function.  He is completely asymptomatic.  We will continue to follow him annually by 2D echo and semiannually with office visits.  As soon as he develops symptoms I will perform right left heart catheter refer him to the structural heart team for consideration of TAVR versus SAVR.     Lorretta Harp MD FACP,FACC,FAHA, [facility] 09/06/2021 11:07 AM

## 2021-09-06 NOTE — Patient Instructions (Signed)
Medication Instructions:  Your physician recommends that you continue on your current medications as directed. Please refer to the Current Medication list given to you today.  *If you need a refill on your cardiac medications before your next appointment, please call your pharmacy*   Testing/Procedures: Your physician has requested that you have an echocardiogram. Echocardiography is a painless test that uses sound waves to create images of your heart. It provides your doctor with information about the size and shape of your heart and how well your hearts chambers and valves are working. This procedure takes approximately one hour. There are no restrictions for this procedure. To be done in December. This procedure is done at [address].    Follow-Up: At [facility], you and your health needs are our priority.  As part of our continuing mission to provide you with exceptional heart care, we have created designated Provider Care Teams.  These Care Teams include your primary Cardiologist (physician) and Advanced Practice Providers (APPs -  Physician Assistants and Nurse Practitioners) who all work together to provide you with the care you need, when you need it.  We recommend signing up for the patient portal called "MyChart".  Sign up information is provided on this After Visit Summary.  MyChart is used to connect with patients for Virtual Visits (Telemedicine).  Patients are able to view lab/test results, encounter notes, upcoming appointments, etc.  Non-urgent messages can be sent to your provider as well.   To learn more about what you can do with MyChart, go to [email or web address].    Your next appointment:   6 month(s)  The format for your next appointment:   In Person  Provider:   Quay Burow, MD

## 2021-09-06 NOTE — Assessment & Plan Note (Signed)
Aortic stenosis has progressed by 2D echo in the out in the severe range this was performed 07/15/2021.  His mean gradient has increased from 30 to 40 mmHg and his valve area of is 0.65 cm with normal LV systolic function.  He is completely asymptomatic.  We will continue to follow him annually by 2D echo and semiannually with office visits.  As soon as he develops symptoms I will perform right left heart catheter refer him to the structural heart team for consideration of TAVR versus SAVR.

## 2021-09-17 ENCOUNTER — Ambulatory Visit: Payer: Medicare Other | Admitting: Cardiovascular Disease

## 2022-01-22 ENCOUNTER — Ambulatory Visit: Payer: Medicare Other | Admitting: Cardiovascular Disease

## 2022-04-03 ENCOUNTER — Ambulatory Visit (HOSPITAL_COMMUNITY): Payer: Medicare Other | Attending: Cardiovascular Disease

## 2022-04-03 DIAGNOSIS — I350 Nonrheumatic aortic (valve) stenosis: Secondary | ICD-10-CM | POA: Diagnosis not present

## 2022-04-03 DIAGNOSIS — E782 Mixed hyperlipidemia: Secondary | ICD-10-CM | POA: Diagnosis not present

## 2022-04-03 LAB — ECHOCARDIOGRAM COMPLETE
AR max vel: 0.6 cm2
AV Area VTI: 0.72 cm2
AV Area mean vel: 0.62 cm2
AV Mean grad: 52 mmHg
AV Peak grad: 82.8 mmHg
Ao pk vel: 4.55 m/s
Area-P 1/2: 2.61 cm2
P 1/2 time: 333 msec
S' Lateral: 3.5 cm

## 2022-04-09 ENCOUNTER — Ambulatory Visit: Payer: Medicare Other | Attending: Cardiovascular Disease | Admitting: Cardiovascular Disease

## 2022-04-09 ENCOUNTER — Encounter: Payer: Self-pay | Admitting: Cardiovascular Disease

## 2022-04-09 VITALS — BP 128/74 | HR 69 | Ht 74.0 in | Wt 195.4 lb

## 2022-04-09 DIAGNOSIS — I350 Nonrheumatic aortic (valve) stenosis: Secondary | ICD-10-CM

## 2022-04-09 DIAGNOSIS — R931 Abnormal findings on diagnostic imaging of heart and coronary circulation: Secondary | ICD-10-CM

## 2022-04-09 DIAGNOSIS — E782 Mixed hyperlipidemia: Secondary | ICD-10-CM | POA: Diagnosis not present

## 2022-04-09 NOTE — Assessment & Plan Note (Signed)
Coronary calcium score of 246 measured 07/26/2020 on statin therapy with a LDL at goal for secondary prevention.  He is totally asymptomatic.

## 2022-04-09 NOTE — Progress Notes (Signed)
04/09/2022 [name]   08-Mar-1951  [id number]  Primary Physician Rankins, Bill Salinas, MD Primary Cardiologist: Lorretta Harp MD Lupe Carney, [place]  HPI:  Zachary Trujillo is a 71 y.o.  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  He was referred by Dr. Drema Dallas for cardiovascular evaluation because of a abnormal 2D echo.  I last saw him in the office 09/06/2021. His risk factors include treated hyperlipidemia and family history with a father who had a myocardial infarction.  He is never had a heart attack or stroke.  He denies chest pain or shortness of breath.  He had a murmur for a long time with a recent echo performed 05/10/2018 revealing mild aortic stenosis with a valve area of 1.07 cm and a peak aortic gradient of 36 mmHg.   He had a TIA while in [place] in the airport heading to [place] earlier this month.  He was hospitalized [facility] for 3 days.  Transesophageal echo showed no left atrial thrombus and his aortic stenosis was moderately severe but unchanged from his previous 2D echo.  He is wearing a 30-day event monitor.  He was also placed on Plavix.  The event monitor was unrevealing.  Since I saw him 7 months ago he continues to do well.   2D echo performed 04/03/2022 revealed normal LV systolic function, moderate AI, severe aortic stenosis with a valve area of 0.72 cm, mean gradient of 52 and peak of 82.  He does complain of some fatigue but denies chest pain or shortness of breath.     Current Meds  Medication Sig   ALPRAZolam (XANAX) 0.25 MG tablet Take 0.25 mg by mouth daily as needed for anxiety (stress).    Blood Glucose Monitoring Suppl (ONE TOUCH ULTRA 2) w/Device KIT 2 (two) times daily.   [employer] ASPIRIN ADULT LOW DOSE 81 MG chewable tablet Chew 81 mg by mouth daily.   lisinopril (ZESTRIL) 10 MG tablet Take 1 tablet by mouth daily.   metFORMIN (GLUCOPHAGE) 500 MG tablet Take by mouth daily.    OneTouch Delica Lancets 46N MISC 2 (two) times daily.   rosuvastatin (CRESTOR) 10 MG tablet Take 10 mg by mouth daily.     No Known Allergies  Social History   Socioeconomic History   Marital status: Married    Spouse name: Not on file   Number of children: Not on file   Years of education: Not on file   Highest education level: Not on file  Occupational History   Not on file  Tobacco Use   Smoking status: Former   Smokeless tobacco: Never  Substance and Sexual Activity   Alcohol use: Not on file   Drug use: Not on file   Sexual activity: Not on file  Other Topics Concern   Not on file  Social History Narrative   Not on file   Social Determinants of Health   Financial Resource Strain: Not on file  Food Insecurity: Not on file  Transportation Needs: Not on file  Physical Activity: Not on file  Stress: Not on file  Social Connections: Not on file  Intimate Partner Violence: Not on file     Review of Systems: General: negative for chills, fever, night sweats or weight changes.  Cardiovascular: negative for chest pain, dyspnea on exertion, edema, orthopnea, palpitations, paroxysmal nocturnal dyspnea or shortness of breath Dermatological: negative for rash Respiratory: negative for cough or wheezing Urologic: negative  for hematuria Abdominal: negative for nausea, vomiting, diarrhea, bright red blood per rectum, melena, or hematemesis Neurologic: negative for visual changes, syncope, or dizziness All other systems reviewed and are otherwise negative except as noted above.    Blood pressure 128/74, pulse 69, height 6' 2" (1.88 m), weight 195 lb 6.4 oz (88.6 kg), SpO2 92 %.  General appearance: alert and no distress Neck: no adenopathy, no JVD, supple, symmetrical, trachea midline, thyroid not enlarged, symmetric, no tenderness/mass/nodules, and bilateral carotid bruits versus transmitted murmur Lungs: clear to auscultation bilaterally Heart: 3/6 outflow tract murmur  consistent with aortic stenosis Extremities: extremities normal, atraumatic, no cyanosis or edema Pulses: 2+ and symmetric Skin: Skin color, texture, turgor normal. No rashes or lesions Neurologic: Grossly normal  EKG sinus rhythm at 69 with anteroseptal Q waves.  I personally reviewed this EKG.  ASSESSMENT AND PLAN:   Hyperlipidemia History of hyperlipidemia on low-dose statin therapy with lipid profile performed 02/26/2022 revealing total cholesterol 137, LDL 73 and HDL of 50.  This is at goal for secondary prevention given his elevated coronary calcium score.  Severe aortic stenosis History of severe aortic stenosis with his most recent 2D echo performed 04/03/2022 revealing normal LV systolic function, grade 1 diastolic dysfunction with moderate AI and severe AS.  His aortic valve area measured 0.72 cm with a mean gradient of 52 mmHg and a peak gradient of 82 mmHg.  He is completely asymptomatic.  I am going to repeat a 2D echo in 6 months after which I will see him back in follow-up.  Elevated coronary artery calcium score Coronary calcium score of 246 measured 07/26/2020 on statin therapy with a LDL at goal for secondary prevention.  He is totally asymptomatic.     Lorretta Harp MD FACP,FACC,FAHA, [facility] 04/09/2022 10:52 AM

## 2022-04-09 NOTE — Assessment & Plan Note (Signed)
History of severe aortic stenosis with his most recent 2D echo performed 04/03/2022 revealing normal LV systolic function, grade 1 diastolic dysfunction with moderate AI and severe AS.  His aortic valve area measured 0.72 cm with a mean gradient of 52 mmHg and a peak gradient of 82 mmHg.  He is completely asymptomatic.  I am going to repeat a 2D echo in 6 months after which I will see him back in follow-up.

## 2022-04-09 NOTE — Patient Instructions (Signed)
Medication Instructions:  Your physician recommends that you continue on your current medications as directed. Please refer to the Current Medication list given to you today.  *If you need a refill on your cardiac medications before your next appointment, please call your pharmacy*   Testing/Procedures: Your physician has requested that you have an echocardiogram. Echocardiography is a painless test that uses sound waves to create images of your heart. It provides your doctor with information about the size and shape of your heart and how well your heart's chambers and valves are working. This procedure takes approximately one hour. There are no restrictions for this procedure. To be done in February 2024. This procedure will be done at [address]    Follow-Up: At [facility], you and your health needs are our priority.  As part of our continuing mission to provide you with exceptional heart care, we have created designated Provider Care Teams.  These Care Teams include your primary Cardiologist (physician) and Advanced Practice Providers (APPs -  Physician Assistants and Nurse Practitioners) who all work together to provide you with the care you need, when you need it.  We recommend signing up for the patient portal called "MyChart".  Sign up information is provided on this After Visit Summary.  MyChart is used to connect with patients for Virtual Visits (Telemedicine).  Patients are able to view lab/test results, encounter notes, upcoming appointments, etc.  Non-urgent messages can be sent to your provider as well.   To learn more about what you can do with MyChart, go to [email or web address].    Your next appointment:   6 month(s)  The format for your next appointment:   In Person  Provider:   Quay Burow, MD

## 2022-04-09 NOTE — Assessment & Plan Note (Signed)
History of hyperlipidemia on low-dose statin therapy with lipid profile performed 02/26/2022 revealing total cholesterol 137, LDL 73 and HDL of 50.  This is at goal for secondary prevention given his elevated coronary calcium score.

## 2022-10-02 ENCOUNTER — Ambulatory Visit (HOSPITAL_COMMUNITY): Payer: Medicare Other | Attending: Cardiovascular Disease

## 2022-10-02 DIAGNOSIS — E782 Mixed hyperlipidemia: Secondary | ICD-10-CM | POA: Insufficient documentation

## 2022-10-02 DIAGNOSIS — I350 Nonrheumatic aortic (valve) stenosis: Secondary | ICD-10-CM | POA: Diagnosis present

## 2022-10-02 LAB — ECHOCARDIOGRAM COMPLETE
AR max vel: 0.77 cm2
AV Area VTI: 0.76 cm2
AV Area mean vel: 0.93 cm2
AV Mean grad: 38 mmHg
AV Peak grad: 55.7 mmHg
Ao pk vel: 3.73 m/s
Area-P 1/2: 2.86 cm2
Est EF: 55
P 1/2 time: 343 msec
S' Lateral: 3.5 cm

## 2022-10-14 ENCOUNTER — Encounter: Payer: Self-pay | Admitting: Cardiovascular Disease

## 2022-10-14 ENCOUNTER — Ambulatory Visit: Payer: Medicare Other | Attending: Cardiovascular Disease | Admitting: Cardiovascular Disease

## 2022-10-14 VITALS — BP 138/72 | HR 69 | Ht 74.0 in | Wt 197.2 lb

## 2022-10-14 DIAGNOSIS — R931 Abnormal findings on diagnostic imaging of heart and coronary circulation: Secondary | ICD-10-CM

## 2022-10-14 DIAGNOSIS — E782 Mixed hyperlipidemia: Secondary | ICD-10-CM | POA: Diagnosis not present

## 2022-10-14 DIAGNOSIS — I350 Nonrheumatic aortic (valve) stenosis: Secondary | ICD-10-CM

## 2022-10-14 NOTE — Progress Notes (Addendum)
10/14/2022 Zachary Trujillo   01/18/1951  [id number]  Primary Physician No primary care provider on file. Primary Cardiologist: Runell Gess MD Nicholes Calamity, [place]  HPI:  Zachary Trujillo is a 72 y.o.  married Caucasian male father of 3, grandfather for grandchildren who is retired Horticulturist, commercial.  He was referred by Dr. Zachery Dauer for cardiovascular evaluation because of a abnormal 2D echo.  I last saw him in the office 04/09/2022. His risk factors include treated hyperlipidemia and family history with a father who had a myocardial infarction.  He is never had a heart attack or stroke.  He denies chest pain or shortness of breath.  He had a murmur for a long time with a recent echo performed 05/10/2018 revealing mild aortic stenosis with a valve area of 1.07 cm and a peak aortic gradient of 36 mmHg.   He had a TIA while in [place] in the airport heading to [place] in 2022.  He was hospitalized [facility] for 3 days.  Transesophageal echo showed no left atrial thrombus and his aortic stenosis was moderately severe but unchanged from his previous 2D echo. He was also placed on Plavix.  The event monitor was unrevealing.  Since I saw him 7 months ago he continues to do well.   2D echo performed 10/02/2022 revealed normal LV systolic function, moderate AI, severe aortic stenosis with a valve area of 0.76 cm, with a peak gradient of 55 mmHg.  He does complain of some fatigue but denies chest pain or shortness of breath.   No outpatient medications have been marked as taking for the 10/14/22 encounter (Office Visit) with Runell Gess, MD.     No Known Allergies  Social History   Socioeconomic History   Marital status: Married    Spouse name: Not on file   Number of children: Not on file   Years of education: Not on file   Highest education level: Not on file  Occupational History   Not on file  Tobacco Use   Smoking status: Former   Smokeless tobacco: Never   Substance and Sexual Activity   Alcohol use: Not on file   Drug use: Not on file   Sexual activity: Not on file  Other Topics Concern   Not on file  Social History Narrative   Not on file   Social Determinants of Health   Financial Resource Strain: Not on file  Food Insecurity: Not on file  Transportation Needs: Not on file  Physical Activity: Not on file  Stress: Not on file  Social Connections: Not on file  Intimate Partner Violence: Not on file     Review of Systems: General: negative for chills, fever, night sweats or weight changes.  Cardiovascular: negative for chest pain, dyspnea on exertion, edema, orthopnea, palpitations, paroxysmal nocturnal dyspnea or shortness of breath Dermatological: negative for rash Respiratory: negative for cough or wheezing Urologic: negative for hematuria Abdominal: negative for nausea, vomiting, diarrhea, bright red blood per rectum, melena, or hematemesis Neurologic: negative for visual changes, syncope, or dizziness All other systems reviewed and are otherwise negative except as noted above.    Blood pressure 138/72, pulse 69, height 6\' 2"  (1.88 m), weight 197 lb 3.2 oz (89.4 kg), SpO2 97 %.  General appearance: alert and no distress Neck: no adenopathy, no JVD, supple, symmetrical, trachea midline, thyroid not enlarged, symmetric, no tenderness/mass/nodules, and bilateral carotid bruits versus transmitted murmur Lungs: clear to auscultation bilaterally Heart: 3/6 outflow tract  murmur consistent with aortic stenosis. Extremities: extremities normal, atraumatic, no cyanosis or edema Pulses: 2+ and symmetric Skin: Skin color, texture, turgor normal. No rashes or lesions Neurologic: Grossly normal  EKG sinus rhythm at 69 with nonspecific ST and T wave changes.  Personally reviewed this EKG.  ASSESSMENT AND PLAN:   Hyperlipidemia History of hyperlipidemia on statin therapy with lipid profile performed 02/26/2022 revealing total  cholesterol 137, LDL 73 and HDL 50.  Severe aortic stenosis History of severe aortic stenosis echo performed 10/02/2022 revealing normal LV systolic function, grade 1 diastolic dysfunction, moderate LVH and severe aortic stenosis with a valve area of 0.76 cm.  He is essentially asymptomatic.  Will continue to follow him noninvasively.  Elevated coronary artery calcium score Elevated coronary calcium score of 246 measured to 07/26/2020 on statin therapy.     Runell Gess MD FACP,FACC,FAHA, [facility] 10/14/2022 11:01 AM

## 2022-10-14 NOTE — Assessment & Plan Note (Signed)
Elevated coronary calcium score of 246 measured to 07/26/2020 on statin therapy.

## 2022-10-14 NOTE — Patient Instructions (Signed)
Medication Instructions:  Your physician recommends that you continue on your current medications as directed. Please refer to the Current Medication list given to you today.  *If you need a refill on your cardiac medications before your next appointment, please call your pharmacy*   Testing/Procedures: Your physician has requested that you have an echocardiogram. Echocardiography is a painless test that uses sound waves to create images of your heart. It provides your doctor with information about the size and shape of your heart and how well your heart's chambers and valves are working. This procedure takes approximately one hour. There are no restrictions for this procedure. Please do NOT wear cologne, perfume, aftershave, or lotions (deodorant is allowed). Please arrive 15 minutes prior to your appointment time. To do in February 2025.   Follow-Up: At [facility], you and your health needs are our priority.  As part of our continuing mission to provide you with exceptional heart care, we have created designated Provider Care Teams.  These Care Teams include your primary Cardiologist (physician) and Advanced Practice Providers (APPs -  Physician Assistants and Nurse Practitioners) who all work together to provide you with the care you need, when you need it.  We recommend signing up for the patient portal called "MyChart".  Sign up information is provided on this After Visit Summary.  MyChart is used to connect with patients for Virtual Visits (Telemedicine).  Patients are able to view lab/test results, encounter notes, upcoming appointments, etc.  Non-urgent messages can be sent to your provider as well.   To learn more about what you can do with MyChart, go to [email or web address].    Your next appointment:   6 month(s)  Provider:   Quay Burow, MD

## 2022-10-14 NOTE — Assessment & Plan Note (Signed)
History of severe aortic stenosis echo performed 10/02/2022 revealing normal LV systolic function, grade 1 diastolic dysfunction, moderate LVH and severe aortic stenosis with a valve area of 0.76 cm.  He is essentially asymptomatic.  Will continue to follow him noninvasively.

## 2022-10-14 NOTE — Assessment & Plan Note (Signed)
History of hyperlipidemia on statin therapy with lipid profile performed 02/26/2022 revealing total cholesterol 137, LDL 73 and HDL 50.

## 2023-01-20 ENCOUNTER — Telehealth: Payer: Self-pay | Admitting: Cardiovascular Disease

## 2023-01-20 NOTE — Telephone Encounter (Signed)
Shana from [facility] calling, she said, she will send clearance request for upcoming procedure. She said, when she read Dr. Hazle Coca notes   He had a TIA while in [place] in the airport heading to [place] earlier this month.  He was hospitalized [facility] for 3 days   Per pt this happened in 2022 not this year. She wanted to let Dr. Allyson Sabal to know

## 2023-01-20 NOTE — Telephone Encounter (Signed)
Will make dr berry aware 

## 2023-01-21 ENCOUNTER — Telehealth: Payer: Self-pay | Admitting: *Deleted

## 2023-01-21 NOTE — Telephone Encounter (Signed)
   Name: Zachary Trujillo  DOB: [birth year]  MRN: [id number]  Primary Cardiologist: None   Preoperative team, please contact this patient and set up a phone call appointment for further preoperative risk assessment. Please obtain consent and complete medication review. Thank you for your help.  I confirm that guidance regarding antiplatelet and oral anticoagulation therapy has been completed and, if necessary, noted below.  From a cardiac perspective, he may hold aspirin for 5 to 7 days prior to procedure.  However, patient also takes aspirin for history of TIA.  Therefore, additional recommendations for holding aspirin prior to procedure should come from (managing provider (PCP/neurology).   Joylene Grapes, NP 01/21/2023, 8:00 AM [facility]

## 2023-01-21 NOTE — Telephone Encounter (Signed)
   Pre-operative Risk Assessment    Patient Name: Zachary Trujillo  DOB: 1950/12/17 MRN: [id number]      Request for Surgical Clearance    Procedure:   COLONOSCOPY  Date of Surgery:  Clearance 02/24/23                                 Surgeon:   Surgeon's Group or Practice Name:  [facility] Phone number:  [phone] Fax number:  [phone]   Type of Clearance Requested:   - Medical  - Pharmacy:  Hold Aspirin NOT INDICATED HOW LONG   Type of Anesthesia:  Not Indicated   Additional requests/questions:    Wilhemina Cash   01/21/2023, 7:16 AM

## 2023-01-21 NOTE — Telephone Encounter (Signed)
1st attempt to reach pt regarding surgical clearance and the need for a tele visit.  Left a message for pt to call back and ask for the preop team. 

## 2023-01-21 NOTE — Telephone Encounter (Signed)
Pt returned my call.  He states that his procedure is being pushed out until October.  He is already scheduled to see Dr. Allyson Sabal in September, and per pt, clearance can be discussed at that time.  Will send back to [facility] to make them aware.

## 2023-04-14 ENCOUNTER — Other Ambulatory Visit: Payer: Self-pay

## 2023-04-14 ENCOUNTER — Ambulatory Visit: Payer: Medicare Other | Attending: Cardiovascular Disease | Admitting: Cardiovascular Disease

## 2023-04-14 ENCOUNTER — Encounter: Payer: Self-pay | Admitting: Cardiovascular Disease

## 2023-04-14 VITALS — BP 150/80 | HR 67 | Ht 73.5 in | Wt 189.0 lb

## 2023-04-14 DIAGNOSIS — R931 Abnormal findings on diagnostic imaging of heart and coronary circulation: Secondary | ICD-10-CM | POA: Diagnosis not present

## 2023-04-14 DIAGNOSIS — E782 Mixed hyperlipidemia: Secondary | ICD-10-CM | POA: Diagnosis not present

## 2023-04-14 DIAGNOSIS — I350 Nonrheumatic aortic (valve) stenosis: Secondary | ICD-10-CM | POA: Diagnosis not present

## 2023-04-14 NOTE — Assessment & Plan Note (Signed)
History of hyperlipidemia on rosuvastatin with lipid profile performed 03/05/2023 revealing total cholesterol 118, LDL 59 and HDL 43.

## 2023-04-14 NOTE — Progress Notes (Signed)
04/14/2023 Zachary Trujillo   1950/10/01  [id number]  Primary Physician Jackelyn Poling, DO Primary Cardiologist: Runell Gess MD Nicholes Calamity, [place]  HPI:  Zachary Trujillo is a 72 y.o.  married Caucasian male father of 3, grandfather for grandchildren who is retired Horticulturist, commercial.  He was referred by Dr. Zachery Dauer for cardiovascular evaluation because of a abnormal 2D echo.  I last saw him in the office 10/14/2022. His risk factors include treated hyperlipidemia and family history with a father who had a myocardial infarction.  He is never had a heart attack or stroke.  He denies chest pain or shortness of breath.  He had a murmur for a long time with a recent echo performed 05/10/2018 revealing mild aortic stenosis with a valve area of 1.07 cm and a peak aortic gradient of 36 mmHg.   He had a TIA while in [place] in the airport heading to [place] in 2022.  He was hospitalized [facility] for 3 days.  Transesophageal echo showed no left atrial thrombus and his aortic stenosis was moderately severe but unchanged from his previous 2D echo. He was also placed on Plavix.  The event monitor was unrevealing.  Since I saw him a year and a half ago he continues to do well.   2D echo performed 10/02/2022 revealed normal LV systolic function, moderate AI, severe aortic stenosis with a valve area of 0.76 cm, with a peak gradient of 55 mmHg.  He does complain of some fatigue but denies chest pain or shortness of breath.   Current Meds  Medication Sig   ALPRAZolam (XANAX) 0.25 MG tablet Take 0.25 mg by mouth daily as needed for anxiety (stress).    Blood Glucose Monitoring Suppl (ONE TOUCH ULTRA 2) w/Device KIT 2 (two) times daily.   [employer] ASPIRIN ADULT LOW DOSE 81 MG chewable tablet Chew 81 mg by mouth daily.   lisinopril (ZESTRIL) 10 MG tablet Take 1 tablet by mouth daily.   metFORMIN (GLUCOPHAGE) 500 MG tablet Take by mouth daily.   OneTouch Delica Lancets 30G MISC 2 (two) times  daily.   rosuvastatin (CRESTOR) 10 MG tablet Take 10 mg by mouth daily.     No Known Allergies  Social History   Socioeconomic History   Marital status: Married    Spouse name: Not on file   Number of children: Not on file   Years of education: Not on file   Highest education level: Not on file  Occupational History   Not on file  Tobacco Use   Smoking status: Former   Smokeless tobacco: Never  Substance and Sexual Activity   Alcohol use: Not on file   Drug use: Not on file   Sexual activity: Not on file  Other Topics Concern   Not on file  Social History Narrative   Not on file   Social Determinants of Health   Financial Resource Strain: Not on file  Food Insecurity: Not on file  Transportation Needs: Not on file  Physical Activity: Not on file  Stress: Not on file  Social Connections: Not on file  Intimate Partner Violence: Not on file     Review of Systems: General: negative for chills, fever, night sweats or weight changes.  Cardiovascular: negative for chest pain, dyspnea on exertion, edema, orthopnea, palpitations, paroxysmal nocturnal dyspnea or shortness of breath Dermatological: negative for rash Respiratory: negative for cough or wheezing Urologic: negative for hematuria Abdominal: negative for nausea, vomiting, diarrhea, bright red blood  per rectum, melena, or hematemesis Neurologic: negative for visual changes, syncope, or dizziness All other systems reviewed and are otherwise negative except as noted above.    Blood pressure (!) 150/80, pulse 67, height 6' 1.5" (1.867 m), weight 189 lb (85.7 kg).  General appearance: alert and no distress Neck: no adenopathy, no JVD, supple, symmetrical, trachea midline, thyroid not enlarged, symmetric, no tenderness/mass/nodules, and bilateral carotid bruits versus transmitted murmur Lungs: clear to auscultation bilaterally Heart: 3/6 outflow tract murmur consistent with aortic stenosis Extremities: extremities  normal, atraumatic, no cyanosis or edema Pulses: 2+ and symmetric Skin: Skin color, texture, turgor normal. No rashes or lesions Neurologic: Grossly normal  EKG EKG Interpretation Date/Time:  Tuesday April 14 2023 10:53:21 EDT Ventricular Rate:  67 PR Interval:  198 QRS Duration:  86 QT Interval:  398 QTC Calculation: 420 R Axis:   -15  Text Interpretation: Normal sinus rhythm Anteroseptal infarct , age undetermined Lateral injury pattern ** ** ACUTE MI / STEMI ** ** When compared with ECG of 14-Aug-2019 17:57, PREVIOUS ECG IS PRESENT Confirmed by Nanetta Batty [phone]) on 04/14/2023 11:22:06 AM    ASSESSMENT AND PLAN:   Hyperlipidemia History of hyperlipidemia on rosuvastatin with lipid profile performed 03/05/2023 revealing total cholesterol 118, LDL 59 and HDL 43.  Severe aortic stenosis History of severe aortic stenosis with 2D echo performed 10/02/2022 revealing normal LV systolic function with an aortic valve area of 0.76 cm and a peak gradient of 55 mmHg.  This will be repeated on an annual basis.  He remains asymptomatic.  Elevated coronary artery calcium score Elevated coronary calcium score of 246 performed 07/26/2020.  He is at goal for lipid therapy for secondary prevention.  He is asymptomatic.     Runell Gess MD FACP,FACC,FAHA, [facility] 04/14/2023 11:32 AM

## 2023-04-14 NOTE — Patient Instructions (Signed)
Medication Instructions:  Your physician recommends that you continue on your current medications as directed. Please refer to the Current Medication list given to you today.  *If you need a refill on your cardiac medications before your next appointment, please call your pharmacy*   Testing/Procedures: Your physician has requested that you have an echocardiogram. Echocardiography is a painless test that uses sound waves to create images of your heart. It provides your doctor with information about the size and shape of your heart and how well your heart's chambers and valves are working. This procedure takes approximately one hour. There are no restrictions for this procedure. Please do NOT wear cologne, perfume, aftershave, or lotions (deodorant is allowed). Please arrive 15 minutes prior to your appointment time. Scheduled for Friday, October 09, 2023.    Follow-Up: At [facility], you and your health needs are our priority.  As part of our continuing mission to provide you with exceptional heart care, we have created designated Provider Care Teams.  These Care Teams include your primary Cardiologist (physician) and Advanced Practice Providers (APPs -  Physician Assistants and Nurse Practitioners) who all work together to provide you with the care you need, when you need it.  We recommend signing up for the patient portal called "MyChart".  Sign up information is provided on this After Visit Summary.  MyChart is used to connect with patients for Virtual Visits (Telemedicine).  Patients are able to view lab/test results, encounter notes, upcoming appointments, etc.  Non-urgent messages can be sent to your provider as well.   To learn more about what you can do with MyChart, go to [email or web address].    Your next appointment:   12 month(s)  Provider: Nanetta Batty, MD

## 2023-04-14 NOTE — Assessment & Plan Note (Signed)
History of severe aortic stenosis with 2D echo performed 10/02/2022 revealing normal LV systolic function with an aortic valve area of 0.76 cm and a peak gradient of 55 mmHg.  This will be repeated on an annual basis.  He remains asymptomatic.

## 2023-04-14 NOTE — Assessment & Plan Note (Signed)
Elevated coronary calcium score of 246 performed 07/26/2020.  He is at goal for lipid therapy for secondary prevention.  He is asymptomatic.

## 2023-04-27 ENCOUNTER — Other Ambulatory Visit: Payer: Self-pay | Admitting: Family Medicine

## 2023-04-27 DIAGNOSIS — R918 Other nonspecific abnormal finding of lung field: Secondary | ICD-10-CM

## 2023-05-13 ENCOUNTER — Other Ambulatory Visit: Payer: Medicare Other

## 2023-05-13 ENCOUNTER — Ambulatory Visit
Admission: RE | Admit: 2023-05-13 | Discharge: 2023-05-13 | Disposition: A | Discharge status: Home / Self Care | Payer: Medicare Other | Source: Ambulatory Visit | Attending: Family Medicine | Admitting: Family Medicine

## 2023-05-13 DIAGNOSIS — R918 Other nonspecific abnormal finding of lung field: Secondary | ICD-10-CM

## 2023-05-13 MED ORDER — IOPAMIDOL (ISOVUE-300) INJECTION 61%
500.0000 mL | Freq: Once | INTRAVENOUS | Status: AC | PRN
  Administered 2023-05-13: 75 mL via INTRAVENOUS

## 2023-08-10 ENCOUNTER — Telehealth: Payer: Self-pay

## 2023-08-10 NOTE — Telephone Encounter (Signed)
   Pre-operative Risk Assessment    Patient Name: Zachary Trujillo  DOB: 1951-07-23 MRN: [id number]   Date of last office visit: 04/14/23 Date of next office visit: 10/09/23   Request for Surgical Clearance    Procedure:   Colonoscopy  Date of Surgery:  Clearance 08/18/23                                Surgeon:  Dr. Layla Lah Surgeon's Group or Practice Name:  [facility] Phone number:  [phone] Fax number:  [phone]   Type of Clearance Requested:   - Medical    Type of Anesthesia:   Propofol   Additional requests/questions:    Bonney Ival LOISE Gerome   08/10/2023, 4:52 PM

## 2023-08-11 NOTE — Telephone Encounter (Signed)
 Pt states that Dr. Court cleared him already in the September 2024 office visit.  His Echo has been scheduled for March, which is 1 year from the other one.    Per pt, why does he have to have it moved up if Dr. Court already cleared him?  I advise DTaP I would have to reach out to our Preop team / Dr. Court to let them decide.

## 2023-08-11 NOTE — Telephone Encounter (Signed)
 Dr. Court, patient's chart was reviewed for preoperative cardiac evaluation.  He has a history of moderate to severe AS with plan for echo in March. Can you please comment on your preference regarding proceeding to colonoscopy prior to echo? We can conduct a telephone interview to ensure that he is not currently symptomatic.    Please route your response to p cv div preop.  Thank you, Rosaline EMERSON Bane, NP-C 08/11/2023, 8:28 AM

## 2023-08-11 NOTE — Telephone Encounter (Signed)
 Called patient to explain reason for request to get echocardiogram prior to colonoscopy. He verbalized understanding. We will try to get echocardiogram prior to 08/18/23.  I have completed the DASI and the RCRI and he is able to achieve 8.97 METS activity without concerning cardiac symptoms. His RCRI is 6.0% due to history of mild stroke. We will await echo results for further recommendations.  Olam, please contact patient to schedule echo as soon as possible.   Rosaline EMERSON Bane, NP-C  08/11/2023, 3:21 PM [address], [address] Office [phone] Fax [phone]

## 2023-08-11 NOTE — Telephone Encounter (Signed)
 Primary Cardiologist:Jonathan Court, MD   Preoperative team, please contact this patient and set up a phone call appointment for further preoperative risk assessment. Please obtain consent and complete medication review. Thank you for your help. Please plan for VV after echo.   Per Dr. Court, patient's primary cardiologist, he will need to have echo and virtual visit prior to colonoscopy due to history of at least moderate aortic stenosis.  I also confirmed the patient resides in the state of [place] . As per [facility] Medical Board telemedicine laws, the patient must reside in the state in which the provider is licensed.   Rosaline EMERSON Bane, NP-C  08/11/2023, 1:30 PM [address], [address] Office [phone] Fax [phone]

## 2023-08-13 NOTE — Telephone Encounter (Signed)
 Patient scheduled for echo on 08/14/23. Clearance will be addressed once echo is complete

## 2023-08-14 ENCOUNTER — Ambulatory Visit (HOSPITAL_COMMUNITY): Payer: Medicare Other | Attending: Cardiovascular Disease

## 2023-08-14 DIAGNOSIS — I350 Nonrheumatic aortic (valve) stenosis: Secondary | ICD-10-CM | POA: Diagnosis not present

## 2023-08-14 DIAGNOSIS — E782 Mixed hyperlipidemia: Secondary | ICD-10-CM | POA: Insufficient documentation

## 2023-08-14 DIAGNOSIS — R931 Abnormal findings on diagnostic imaging of heart and coronary circulation: Secondary | ICD-10-CM | POA: Diagnosis not present

## 2023-08-14 LAB — ECHOCARDIOGRAM COMPLETE
AR max vel: 0.74 cm2
AV Area VTI: 0.98 cm2
AV Area mean vel: 0.8 cm2
AV Mean grad: 39.9 mm[Hg]
AV Peak grad: 68.8 mm[Hg]
Ao pk vel: 4.15 m/s
Area-P 1/2: 2.47 cm2
S' Lateral: 3.5 cm

## 2023-08-14 NOTE — Telephone Encounter (Signed)
 Will update the requesting office the pt is scheduled for echo 08/14/23. Once the echo has been read and the pt has been cleared our office will be sure to fax notes providing clearance and any recommendations.

## 2023-08-17 NOTE — Telephone Encounter (Signed)
 I will forward to preop APP to review if the pt has been cleared. Pt called today to see if he has been cleared.

## 2023-08-17 NOTE — Telephone Encounter (Signed)
   Patient Name: Zachary Trujillo  DOB: 1951-02-14 MRN: [id number]  Primary Cardiologist: Dorn Lesches, MD  Chart reviewed as part of pre-operative protocol coverage. Given past medical history and time since last visit, based on ACC/AHA guidelines, Junaid Wurzer is at acceptable risk for the planned procedure without further cardiovascular testing.   Echo completed showing normal heart pump function with severe aortic stenosis significant change from previous study and recommendation of repeat echo in 12 months.  The patient was advised that if he develops new symptoms prior to surgery to contact our office to arrange for a follow-up visit, and he verbalized understanding.  I will route this recommendation to the requesting party via Epic fax function and remove from pre-op pool.  Please call with questions.  Wyn Raddle, Jackee Shove, NP 08/17/2023, 9:44 AM

## 2023-08-17 NOTE — Telephone Encounter (Signed)
 Pt requesting cb regarding update on clearance

## 2023-09-11 ENCOUNTER — Encounter: Payer: Self-pay | Admitting: Family Medicine

## 2023-09-15 ENCOUNTER — Other Ambulatory Visit: Payer: Self-pay | Admitting: Family Medicine

## 2023-09-15 DIAGNOSIS — R911 Solitary pulmonary nodule: Secondary | ICD-10-CM

## 2023-09-17 ENCOUNTER — Ambulatory Visit
Admission: RE | Admit: 2023-09-17 | Discharge: 2023-09-17 | Discharge status: Home / Self Care | Payer: Medicare Other | Source: Ambulatory Visit | Attending: Family Medicine

## 2023-09-17 DIAGNOSIS — R911 Solitary pulmonary nodule: Secondary | ICD-10-CM

## 2023-10-09 ENCOUNTER — Other Ambulatory Visit (HOSPITAL_COMMUNITY): Payer: Medicare Other

## 2024-04-14 ENCOUNTER — Encounter: Payer: Self-pay | Admitting: Cardiovascular Disease

## 2024-04-25 ENCOUNTER — Encounter: Payer: Self-pay | Admitting: Cardiovascular Disease

## 2024-04-25 ENCOUNTER — Ambulatory Visit: Attending: Cardiovascular Disease | Admitting: Cardiovascular Disease

## 2024-04-25 VITALS — BP 128/78 | HR 66 | Ht 72.0 in | Wt 195.2 lb

## 2024-04-25 DIAGNOSIS — E782 Mixed hyperlipidemia: Secondary | ICD-10-CM | POA: Diagnosis not present

## 2024-04-25 DIAGNOSIS — I350 Nonrheumatic aortic (valve) stenosis: Secondary | ICD-10-CM

## 2024-04-25 DIAGNOSIS — R931 Abnormal findings on diagnostic imaging of heart and coronary circulation: Secondary | ICD-10-CM

## 2024-04-25 NOTE — Patient Instructions (Signed)
 Medication Instructions:  Your physician recommends that you continue on your current medications as directed. Please refer to the Current Medication list given to you today.  *If you need a refill on your cardiac medications before your next appointment, please call your pharmacy*   Testing/Procedures: Your physician has requested that you have an echocardiogram. Echocardiography is a painless test that uses sound waves to create images of your heart. It provides your doctor with information about the size and shape of your heart and how well your heart's chambers and valves are working. This procedure takes approximately one hour. There are no restrictions for this procedure. Please do NOT wear cologne, perfume, aftershave, or lotions (deodorant is allowed). Please arrive 15 minutes prior to your appointment time.  Please note: We ask at that you not bring children with you during ultrasound (echo/ vascular) testing. Due to room size and safety concerns, children are not allowed in the ultrasound rooms during exams. Our front office staff cannot provide observation of children in our lobby area while testing is being conducted. An adult accompanying a patient to their appointment will only be allowed in the ultrasound room at the discretion of the ultrasound technician under special circumstances. We apologize for any inconvenience. **To do in January 2026**   Follow-Up: At [facility], you and your health needs are our priority.  As part of our continuing mission to provide you with exceptional heart care, our providers are all part of one team.  This team includes your primary Cardiologist (physician) and Advanced Practice Providers or APPs (Physician Assistants and Nurse Practitioners) who all work together to provide you with the care you need, when you need it.  Your next appointment:   12 month(s)  Provider:   Dorn Lesches, MD    We recommend signing up for the patient  portal called MyChart.  Sign up information is provided on this After Visit Summary.  MyChart is used to connect with patients for Virtual Visits (Telemedicine).  Patients are able to view lab/test results, encounter notes, upcoming appointments, etc.  Non-urgent messages can be sent to your provider as well.   To learn more about what you can do with MyChart, go to [email or web address].   Other Instructions Heart-Healthy Eating Plan Eating a healthy diet is important for the health of your heart. A heart-healthy eating plan includes: Eating less unhealthy fats. Eating more healthy fats. Eating less salt in your food. Salt is also called sodium. Making other changes in your diet. Cooking Avoid frying your food. Try to bake, boil, grill, or broil it instead. You can also reduce fat by: Removing the skin from poultry. Removing all visible fats from meats. Steaming vegetables in water or broth. Meal planning  At meals, divide your plate into four equal parts: Fill one-half of your plate with vegetables and green salads. Fill one-fourth of your plate with whole grains. Fill one-fourth of your plate with lean protein foods. Eat 2-4 cups of vegetables per day. One cup of vegetables is: 1 cup (91 g) broccoli or cauliflower florets. 2 medium carrots. 1 large bell pepper. 1 large sweet potato. 1 large tomato. 1 medium white potato. 2 cups (150 g) raw leafy greens. Eat 1-2 cups of fruit per day. One cup of fruit is: 1 small apple 1 large banana 1 cup (237 g) mixed fruit, 1 large orange,  cup (82 g) dried fruit, 1 cup (240 mL) 100% fruit juice. Eat more foods that have soluble fiber. These are  apples, broccoli, carrots, beans, peas, and barley. Try to get 20-30 g of fiber per day. Eat 4-5 servings of nuts, legumes, and seeds per week: 1 serving of dried beans or legumes equals  cup (90 g) cooked. 1 serving of nuts is  oz (12 almonds, 24 pistachios, or 7 walnut halves). 1  serving of seeds equals  oz (8 g). General information Eat more home-cooked food. Eat less restaurant, buffet, and fast food. Limit or avoid alcohol. Limit foods that are high in starch and sugar. Avoid fried foods. Lose weight if you are overweight. Keep track of how much salt (sodium) you eat. This is important if you have high blood pressure. Ask your doctor to tell you more about this. Try to add vegetarian meals each week. Fats Choose healthy fats. These include olive oil and canola oil, flaxseeds, walnuts, almonds, and seeds. Eat more omega-3 fats. These include salmon, mackerel, sardines, tuna, flaxseed oil, and ground flaxseeds. Try to eat fish at least 2 times each week. Check food labels. Avoid foods with trans fats or high amounts of saturated fat. Limit saturated fats. These are often found in animal products, such as meats, butter, and cream. These are also found in plant foods, such as palm oil, palm kernel oil, and coconut oil. Avoid foods with partially hydrogenated oils in them. These have trans fats. Examples are stick margarine, some tub margarines, cookies, crackers, and other baked goods. What foods should I eat? Fruits All fresh, canned (in natural juice), or frozen fruits. Vegetables Fresh or frozen vegetables (raw, steamed, roasted, or grilled). Green salads. Grains Most grains. Choose whole wheat and whole grains most of the time. Rice and pasta, including brown rice and pastas made with whole wheat. Meats and other proteins Lean, well-trimmed beef, veal, pork, and lamb. Chicken and [place] without skin. All fish and shellfish. Wild duck, rabbit, pheasant, and venison. Egg whites or low-cholesterol egg substitutes. Dried beans, peas, lentils, and tofu. Seeds and most nuts. Dairy Low-fat or nonfat cheeses, including ricotta and mozzarella. Skim or 1% milk that is liquid, powdered, or evaporated. Buttermilk that is made with low-fat milk. Nonfat or low-fat  yogurt. Fats and oils Non-hydrogenated (trans-free) margarines. Vegetable oils, including soybean, sesame, sunflower, olive, peanut, safflower, corn, canola, and cottonseed. Salad dressings or mayonnaise made with a vegetable oil. Beverages Mineral water. Coffee and tea. Diet carbonated beverages. Sweets and desserts Sherbet, gelatin, and fruit ice. Small amounts of dark chocolate. Limit all sweets and desserts. Seasonings and condiments All seasonings and condiments. The items listed above may not be a complete list of foods and drinks you can eat. Contact a dietitian for more options. What foods should I avoid? Fruits Canned fruit in heavy syrup. Fruit in cream or butter sauce. Fried fruit. Limit coconut. Vegetables Vegetables cooked in cheese, cream, or butter sauce. Fried vegetables. Grains Breads that are made with saturated or trans fats, oils, or whole milk. Croissants. Sweet rolls. Donuts. High-fat crackers, such as cheese crackers. Meats and other proteins Fatty meats, such as hot dogs, ribs, sausage, bacon, rib-eye roast or steak. High-fat deli meats, such as salami and bologna. Caviar. Domestic duck and goose. Organ meats, such as liver. Dairy Cream, sour cream, cream cheese, and creamed cottage cheese. Whole-milk cheeses. Whole or 2% milk that is liquid, evaporated, or condensed. Whole buttermilk. Cream sauce or high-fat cheese sauce. Yogurt that is made from whole milk. Fats and oils Meat fat, or shortening. Cocoa butter, hydrogenated oils, palm oil, coconut oil, palm  kernel oil. Solid fats and shortenings, including bacon fat, salt pork, lard, and butter. Nondairy cream substitutes. Salad dressings with cheese or sour cream. Beverages Regular sodas and juice drinks with added sugar. Sweets and desserts Frosting. Pudding. Cookies. Cakes. Pies. Milk chocolate or white chocolate. Buttered syrups. Full-fat ice cream or ice cream drinks. The items listed above may not be a  complete list of foods and drinks to avoid. Contact a dietitian for more information. Summary Heart-healthy meal planning includes eating less unhealthy fats, eating more healthy fats, and making other changes in your diet. Eat a balanced diet. This includes fruits and vegetables, low-fat or nonfat dairy, lean protein, nuts and legumes, whole grains, and heart-healthy oils and fats. This information is not intended to replace advice given to you by your health care provider. Make sure you discuss any questions you have with your health care provider. Document Revised: 08/26/2021 Document Reviewed: 08/26/2021 Elsevier Patient Education  2024 [employer].

## 2024-04-25 NOTE — Progress Notes (Signed)
 04/25/2024 Zachary Trujillo   17-Sep-1950 [id number]  Primary Physician Dayna Motto, DO Primary Cardiologist: Dorn JINNY Lesches MD [place], [place]  HPI:  Zachary Trujillo is a 73 y.o.  married Caucasian male father of 3, grandfather of 3 grandchildren who is retired Horticulturist, commercial.  He was referred by Dr. Gwenn for cardiovascular evaluation because of a abnormal 2D echo.  I last saw him in the office 04/14/2023. His risk factors include treated hyperlipidemia and family history with a father who had a myocardial infarction.  He is never had a heart attack or stroke.  He denies chest pain or shortness of breath.  He had a murmur for a long time with a recent echo performed 05/10/2018 revealing mild aortic stenosis with a valve area of 1.07 cm and a peak aortic gradient of 36 mmHg.   He had a TIA while in [place] in the airport heading to [place] in 2022.  He was hospitalized [facility] for 3 days.  Transesophageal echo showed no left atrial thrombus and his aortic stenosis was moderately severe but unchanged from his previous 2D echo. He was also placed on Plavix.  The event monitor was unrevealing.  Since I saw him a year and a half ago he continues to do well.   2D echo performed 08/14/2023 revealed normal LV systolic function, grade 1 diastolic dysfunction and severe aortic stenosis valve area of 0.98 cm and mean gradient of 40 mmHg unchanged from his last echo.  He is fairly active and completely asymptomatic.   Current Meds  Medication Sig   ACCU-CHEK GUIDE TEST test strip 1 each by Other route as needed for other.   ALPRAZolam (XANAX) 0.25 MG tablet Take 0.25 mg by mouth daily as needed for anxiety (stress).    Blood Glucose Monitoring Suppl (ONE TOUCH ULTRA 2) w/Device KIT 2 (two) times daily.   [employer] ASPIRIN ADULT LOW DOSE 81 MG chewable tablet Chew 81 mg by mouth daily.   lisinopril (ZESTRIL) 10 MG tablet Take 1 tablet by mouth daily.   metFORMIN (GLUCOPHAGE)  500 MG tablet Take by mouth daily.   OneTouch Delica Lancets 30G MISC 2 (two) times daily.   rosuvastatin (CRESTOR) 10 MG tablet Take 10 mg by mouth daily.     No Known Allergies  Social History   Socioeconomic History   Marital status: Married    Spouse name: Not on file   Number of children: Not on file   Years of education: Not on file   Highest education level: Not on file  Occupational History   Not on file  Tobacco Use   Smoking status: Former   Smokeless tobacco: Never  Substance and Sexual Activity   Alcohol use: Not on file   Drug use: Not on file   Sexual activity: Not on file  Other Topics Concern   Not on file  Social History Narrative   Not on file   Social Drivers of Health   Financial Resource Strain: Not on file  Food Insecurity: Not on file  Transportation Needs: Not on file  Physical Activity: Not on file  Stress: Not on file  Social Connections: Not on file  Intimate Partner Violence: Not on file     Review of Systems: General: negative for chills, fever, night sweats or weight changes.  Cardiovascular: negative for chest pain, dyspnea on exertion, edema, orthopnea, palpitations, paroxysmal nocturnal dyspnea or shortness of breath Dermatological: negative for rash Respiratory: negative for cough or  wheezing Urologic: negative for hematuria Abdominal: negative for nausea, vomiting, diarrhea, bright red blood per rectum, melena, or hematemesis Neurologic: negative for visual changes, syncope, or dizziness All other systems reviewed and are otherwise negative except as noted above.    Blood pressure 128/78, pulse 66, height 6' (1.829 m), weight 195 lb 3.2 oz (88.5 kg), SpO2 95%.  General appearance: alert and no distress Neck: no adenopathy, no carotid bruit, no JVD, supple, symmetrical, trachea midline, and thyroid  not enlarged, symmetric, no tenderness/mass/nodules Lungs: clear to auscultation bilaterally Heart: 2/6 outflow tract murmur  consistent with aortic stenosis Extremities: edema   Pulses: 2+ and symmetric Skin: Skin color, texture, turgor normal. No rashes or lesions Neurologic: Grossly normal  EKG EKG Interpretation Date/Time:  Monday April 25 2024 14:33:31 EDT Ventricular Rate:  66 PR Interval:  188 QRS Duration:  94 QT Interval:  422 QTC Calculation: 442 R Axis:   4  Text Interpretation: Normal sinus rhythm Septal infarct (cited on or before 14-Apr-2023) ST & T wave abnormality, consider lateral ischemia When compared with ECG of 14-Apr-2023 10:53, Serial changes of evolving Septal infarct Present Confirmed by Court Carrier [id number]) on 04/25/2024 2:40:30 PM    ASSESSMENT AND PLAN:   Hyperlipidemia History of hyperlipidemia on low-dose statin therapy with lipid profile performed 03/11/2024 revealing total cholesterol 137, LDL of 75 and HDL of 46.  Severe aortic stenosis History of severe aortic stenosis with echo performed 08/14/2023 revealing normal LV systolic function with grade 1 diastolic dysfunction and severe aortic stenosis with a valve area of 0.98 cm and a mean gradient of 40 mmHg unchanged from his prior echo.  He is completely asymptomatic and is fairly active.  Will continue to perform echo surveillance on an annual basis.  Elevated coronary artery calcium  score Elevated coronary calcium  score of 246 performed 07/26/2020.  He is asymptomatic and near goal for secondary prevention.     Carrier DOROTHA Court MD FACP,FACC,FAHA, [facility] 04/25/2024 2:48 PM

## 2024-04-25 NOTE — Assessment & Plan Note (Signed)
 History of severe aortic stenosis with echo performed 08/14/2023 revealing normal LV systolic function with grade 1 diastolic dysfunction and severe aortic stenosis with a valve area of 0.98 cm and a mean gradient of 40 mmHg unchanged from his prior echo.  He is completely asymptomatic and is fairly active.  Will continue to perform echo surveillance on an annual basis.

## 2024-04-25 NOTE — Assessment & Plan Note (Signed)
 History of hyperlipidemia on low-dose statin therapy with lipid profile performed 03/11/2024 revealing total cholesterol 137, LDL of 75 and HDL of 46.

## 2024-04-25 NOTE — Assessment & Plan Note (Signed)
 Elevated coronary calcium  score of 246 performed 07/26/2020.  He is asymptomatic and near goal for secondary prevention.

## 2024-08-17 ENCOUNTER — Other Ambulatory Visit (HOSPITAL_COMMUNITY)

## 2024-09-02 ENCOUNTER — Ambulatory Visit (HOSPITAL_COMMUNITY)
Admission: RE | Admit: 2024-09-02 | Discharge: 2024-09-02 | Disposition: A | Source: Ambulatory Visit | Attending: Cardiovascular Disease | Admitting: Cardiovascular Disease

## 2024-09-02 DIAGNOSIS — E782 Mixed hyperlipidemia: Secondary | ICD-10-CM | POA: Diagnosis present

## 2024-09-02 DIAGNOSIS — I350 Nonrheumatic aortic (valve) stenosis: Secondary | ICD-10-CM | POA: Insufficient documentation

## 2024-09-02 DIAGNOSIS — R931 Abnormal findings on diagnostic imaging of heart and coronary circulation: Secondary | ICD-10-CM | POA: Insufficient documentation

## 2024-09-02 LAB — ECHOCARDIOGRAM COMPLETE
AR max vel: 0.86 cm2
AV Area VTI: 0.84 cm2
AV Area mean vel: 0.85 cm2
AV Mean grad: 46.4 mmHg
AV Peak grad: 82.4 mmHg
Ao pk vel: 4.54 m/s
Area-P 1/2: 2.37 cm2
P 1/2 time: 438 ms
S' Lateral: 2.9 cm

## 2024-09-03 ENCOUNTER — Ambulatory Visit: Payer: Self-pay | Admitting: Cardiovascular Disease

## 2024-09-03 DIAGNOSIS — I350 Nonrheumatic aortic (valve) stenosis: Secondary | ICD-10-CM

## 2024-09-03 DIAGNOSIS — R931 Abnormal findings on diagnostic imaging of heart and coronary circulation: Secondary | ICD-10-CM

## 2024-09-03 DIAGNOSIS — E782 Mixed hyperlipidemia: Secondary | ICD-10-CM
